# Patient Record
Sex: Female | Born: 1945
Health system: Southern US, Community
[De-identification: ages and names within clinical notes are randomized; demographics above are authoritative.]

## PROBLEM LIST (undated history)

## (undated) DIAGNOSIS — C189 Malignant neoplasm of colon, unspecified: Secondary | ICD-10-CM

## (undated) DIAGNOSIS — E538 Deficiency of other specified B group vitamins: Secondary | ICD-10-CM

## (undated) DIAGNOSIS — C801 Malignant (primary) neoplasm, unspecified: Secondary | ICD-10-CM

## (undated) DIAGNOSIS — Z8051 Family history of malignant neoplasm of kidney: Secondary | ICD-10-CM

## (undated) DIAGNOSIS — M5030 Other cervical disc degeneration, unspecified cervical region: Secondary | ICD-10-CM

## (undated) DIAGNOSIS — R112 Nausea with vomiting, unspecified: Secondary | ICD-10-CM

## (undated) DIAGNOSIS — Z923 Personal history of irradiation: Secondary | ICD-10-CM

## (undated) DIAGNOSIS — Z800 Family history of malignant neoplasm of digestive organs: Secondary | ICD-10-CM

## (undated) DIAGNOSIS — I672 Cerebral atherosclerosis: Secondary | ICD-10-CM

## (undated) DIAGNOSIS — D649 Anemia, unspecified: Secondary | ICD-10-CM

## (undated) DIAGNOSIS — N390 Urinary tract infection, site not specified: Secondary | ICD-10-CM

## (undated) DIAGNOSIS — Z8679 Personal history of other diseases of the circulatory system: Secondary | ICD-10-CM

## (undated) DIAGNOSIS — Z87442 Personal history of urinary calculi: Secondary | ICD-10-CM

## (undated) DIAGNOSIS — Z9221 Personal history of antineoplastic chemotherapy: Secondary | ICD-10-CM

## (undated) DIAGNOSIS — Z8639 Personal history of other endocrine, nutritional and metabolic disease: Secondary | ICD-10-CM

## (undated) DIAGNOSIS — Z8781 Personal history of (healed) traumatic fracture: Secondary | ICD-10-CM

## (undated) DIAGNOSIS — M1990 Unspecified osteoarthritis, unspecified site: Secondary | ICD-10-CM

## (undated) DIAGNOSIS — N281 Cyst of kidney, acquired: Secondary | ICD-10-CM

## (undated) DIAGNOSIS — K219 Gastro-esophageal reflux disease without esophagitis: Secondary | ICD-10-CM

## (undated) DIAGNOSIS — G2581 Restless legs syndrome: Secondary | ICD-10-CM

## (undated) DIAGNOSIS — Z803 Family history of malignant neoplasm of breast: Secondary | ICD-10-CM

## (undated) DIAGNOSIS — M810 Age-related osteoporosis without current pathological fracture: Secondary | ICD-10-CM

## (undated) DIAGNOSIS — I10 Essential (primary) hypertension: Secondary | ICD-10-CM

## (undated) DIAGNOSIS — T7840XA Allergy, unspecified, initial encounter: Secondary | ICD-10-CM

## (undated) DIAGNOSIS — H9319 Tinnitus, unspecified ear: Secondary | ICD-10-CM

## (undated) DIAGNOSIS — M4802 Spinal stenosis, cervical region: Secondary | ICD-10-CM

## (undated) DIAGNOSIS — Z853 Personal history of malignant neoplasm of breast: Secondary | ICD-10-CM

## (undated) HISTORY — DX: Family history of malignant neoplasm of digestive organs: Z80.0

## (undated) HISTORY — DX: Deficiency of other specified B group vitamins: E53.8

## (undated) HISTORY — DX: Unspecified osteoarthritis, unspecified site: M19.90

## (undated) HISTORY — DX: Family history of malignant neoplasm of breast: Z80.3

## (undated) HISTORY — DX: Cyst of kidney, acquired: N28.1

## (undated) HISTORY — DX: Tinnitus, unspecified ear: H93.19

## (undated) HISTORY — DX: Personal history of other endocrine, nutritional and metabolic disease: Z86.39

## (undated) HISTORY — DX: Malignant (primary) neoplasm, unspecified: C80.1

## (undated) HISTORY — DX: Personal history of malignant neoplasm of breast: Z85.3

## (undated) HISTORY — DX: Cerebral atherosclerosis: I67.2

## (undated) HISTORY — PX: COLONOSCOPY: SHX174

## (undated) HISTORY — PX: KNEE ARTHROSCOPY: SUR90

## (undated) HISTORY — DX: Allergy, unspecified, initial encounter: T78.40XA

## (undated) HISTORY — DX: Personal history of other diseases of the circulatory system: Z86.79

## (undated) HISTORY — DX: Personal history of (healed) traumatic fracture: Z87.81

## (undated) HISTORY — DX: Essential (primary) hypertension: I10

## (undated) HISTORY — DX: Other cervical disc degeneration, unspecified cervical region: M50.30

## (undated) HISTORY — PX: CHOLECYSTECTOMY: SHX55

## (undated) HISTORY — DX: Spinal stenosis, cervical region: M48.02

## (undated) HISTORY — PX: BREAST LUMPECTOMY: SHX2

## (undated) HISTORY — DX: Gastro-esophageal reflux disease without esophagitis: K21.9

## (undated) HISTORY — DX: Urinary tract infection, site not specified: N39.0

## (undated) HISTORY — PX: PARTIAL HYSTERECTOMY: SHX80

## (undated) HISTORY — DX: Anemia, unspecified: D64.9

## (undated) HISTORY — DX: Family history of malignant neoplasm of kidney: Z80.51

## (undated) HISTORY — DX: Restless legs syndrome: G25.81

## (undated) HISTORY — DX: Age-related osteoporosis without current pathological fracture: M81.0

## (undated) HISTORY — PX: OTHER SURGICAL HISTORY: SHX169

---

## 1999-03-05 ENCOUNTER — Other Ambulatory Visit: Admission: RE | Admit: 1999-03-05 | Discharge: 1999-03-05 | Payer: Self-pay | Admitting: Gynecology

## 1999-10-18 ENCOUNTER — Encounter: Admission: RE | Admit: 1999-10-18 | Discharge: 1999-10-18 | Payer: Self-pay | Admitting: Urology

## 1999-10-18 ENCOUNTER — Encounter: Payer: Self-pay | Admitting: General Surgery

## 2000-10-19 ENCOUNTER — Encounter: Payer: Self-pay | Admitting: Gynecology

## 2000-10-19 ENCOUNTER — Encounter: Admission: RE | Admit: 2000-10-19 | Discharge: 2000-10-19 | Payer: Self-pay | Admitting: Gynecology

## 2001-10-20 ENCOUNTER — Encounter: Payer: Self-pay | Admitting: Gynecology

## 2001-10-20 ENCOUNTER — Encounter: Admission: RE | Admit: 2001-10-20 | Discharge: 2001-10-20 | Payer: Self-pay | Admitting: Gynecology

## 2002-12-14 ENCOUNTER — Encounter: Admission: RE | Admit: 2002-12-14 | Discharge: 2002-12-14 | Payer: Self-pay | Admitting: Gynecology

## 2003-01-09 ENCOUNTER — Encounter (HOSPITAL_COMMUNITY): Admission: RE | Admit: 2003-01-09 | Discharge: 2003-04-09 | Payer: Self-pay | Admitting: Gynecology

## 2004-01-12 ENCOUNTER — Encounter: Admission: RE | Admit: 2004-01-12 | Discharge: 2004-01-12 | Payer: Self-pay | Admitting: Gynecology

## 2004-05-23 ENCOUNTER — Other Ambulatory Visit: Admission: RE | Admit: 2004-05-23 | Discharge: 2004-05-23 | Payer: Self-pay | Admitting: Gynecology

## 2005-02-10 HISTORY — PX: BREAST LUMPECTOMY: SHX2

## 2005-05-07 ENCOUNTER — Encounter: Admission: RE | Admit: 2005-05-07 | Discharge: 2005-05-07 | Payer: Self-pay | Admitting: General Surgery

## 2005-05-07 ENCOUNTER — Encounter (INDEPENDENT_AMBULATORY_CARE_PROVIDER_SITE_OTHER): Payer: Self-pay | Admitting: Diagnostic Radiology

## 2005-05-07 ENCOUNTER — Encounter (INDEPENDENT_AMBULATORY_CARE_PROVIDER_SITE_OTHER): Payer: Self-pay | Admitting: Specialist

## 2005-05-24 ENCOUNTER — Encounter: Admission: RE | Admit: 2005-05-24 | Discharge: 2005-05-24 | Payer: Self-pay | Admitting: General Surgery

## 2005-05-28 ENCOUNTER — Encounter: Admission: RE | Admit: 2005-05-28 | Discharge: 2005-05-28 | Payer: Self-pay | Admitting: General Surgery

## 2005-05-29 ENCOUNTER — Ambulatory Visit (HOSPITAL_BASED_OUTPATIENT_CLINIC_OR_DEPARTMENT_OTHER): Admission: RE | Admit: 2005-05-29 | Discharge: 2005-05-29 | Payer: Self-pay | Admitting: General Surgery

## 2005-05-29 ENCOUNTER — Encounter (INDEPENDENT_AMBULATORY_CARE_PROVIDER_SITE_OTHER): Payer: Self-pay | Admitting: Specialist

## 2005-06-02 ENCOUNTER — Ambulatory Visit: Payer: Self-pay | Admitting: Oncology

## 2005-06-11 LAB — COMPREHENSIVE METABOLIC PANEL
ALT: 19 U/L (ref 0–40)
AST: 24 U/L (ref 0–37)
Alkaline Phosphatase: 78 U/L (ref 39–117)
Chloride: 104 mEq/L (ref 96–112)
Creatinine, Ser: 1 mg/dL (ref 0.4–1.2)
Total Bilirubin: 0.4 mg/dL (ref 0.3–1.2)

## 2005-06-11 LAB — CBC WITH DIFFERENTIAL/PLATELET
BASO%: 0.9 % (ref 0.0–2.0)
EOS%: 0.5 % (ref 0.0–7.0)
HCT: 39.1 % (ref 34.8–46.6)
LYMPH%: 23.3 % (ref 14.0–48.0)
MCH: 28.9 pg (ref 26.0–34.0)
MCHC: 34.1 g/dL (ref 32.0–36.0)
MCV: 84.7 fL (ref 81.0–101.0)
MONO%: 3.4 % (ref 0.0–13.0)
NEUT%: 71.9 % (ref 39.6–76.8)
lymph#: 1.7 10*3/uL (ref 0.9–3.3)

## 2005-06-20 ENCOUNTER — Ambulatory Visit (HOSPITAL_COMMUNITY): Admission: RE | Admit: 2005-06-20 | Discharge: 2005-06-20 | Payer: Self-pay | Admitting: Oncology

## 2005-06-23 ENCOUNTER — Ambulatory Visit: Payer: Self-pay | Admitting: Oncology

## 2005-06-25 ENCOUNTER — Ambulatory Visit: Payer: Self-pay | Admitting: Cardiology

## 2005-06-25 ENCOUNTER — Encounter: Payer: Self-pay | Admitting: Cardiology

## 2005-06-25 ENCOUNTER — Ambulatory Visit: Admission: RE | Admit: 2005-06-25 | Discharge: 2005-06-25 | Payer: Self-pay | Admitting: Oncology

## 2005-06-27 ENCOUNTER — Ambulatory Visit (HOSPITAL_COMMUNITY): Admission: RE | Admit: 2005-06-27 | Discharge: 2005-06-27 | Payer: Self-pay | Admitting: General Surgery

## 2005-07-04 ENCOUNTER — Ambulatory Visit: Payer: Self-pay | Admitting: Oncology

## 2005-08-18 ENCOUNTER — Ambulatory Visit: Payer: Self-pay | Admitting: Oncology

## 2005-10-01 ENCOUNTER — Ambulatory Visit: Payer: Self-pay | Admitting: Cardiology

## 2005-10-01 ENCOUNTER — Ambulatory Visit: Admission: RE | Admit: 2005-10-01 | Discharge: 2005-10-01 | Payer: Self-pay | Admitting: Oncology

## 2005-10-01 ENCOUNTER — Encounter: Payer: Self-pay | Admitting: Cardiology

## 2005-10-03 ENCOUNTER — Ambulatory Visit: Payer: Self-pay | Admitting: Oncology

## 2005-10-10 ENCOUNTER — Ambulatory Visit: Payer: Self-pay | Admitting: Oncology

## 2005-11-21 ENCOUNTER — Ambulatory Visit: Payer: Self-pay | Admitting: Oncology

## 2005-12-11 ENCOUNTER — Ambulatory Visit (HOSPITAL_COMMUNITY): Admission: RE | Admit: 2005-12-11 | Discharge: 2005-12-11 | Payer: Self-pay | Admitting: Oncology

## 2005-12-16 ENCOUNTER — Ambulatory Visit: Admission: EM | Admit: 2005-12-16 | Discharge: 2005-12-16 | Payer: Self-pay | Admitting: Oncology

## 2005-12-16 ENCOUNTER — Encounter (INDEPENDENT_AMBULATORY_CARE_PROVIDER_SITE_OTHER): Payer: Self-pay | Admitting: Cardiology

## 2005-12-17 ENCOUNTER — Encounter: Admission: RE | Admit: 2005-12-17 | Discharge: 2005-12-17 | Payer: Self-pay | Admitting: Oncology

## 2005-12-22 ENCOUNTER — Ambulatory Visit: Admission: RE | Admit: 2005-12-22 | Discharge: 2006-03-11 | Payer: Self-pay | Admitting: Radiation Oncology

## 2006-01-09 ENCOUNTER — Ambulatory Visit: Payer: Self-pay | Admitting: Oncology

## 2006-02-27 ENCOUNTER — Ambulatory Visit: Payer: Self-pay | Admitting: Oncology

## 2006-04-02 ENCOUNTER — Ambulatory Visit: Admission: RE | Admit: 2006-04-02 | Discharge: 2006-04-02 | Payer: Self-pay | Admitting: Oncology

## 2006-04-02 ENCOUNTER — Encounter (INDEPENDENT_AMBULATORY_CARE_PROVIDER_SITE_OTHER): Payer: Self-pay | Admitting: *Deleted

## 2006-04-27 ENCOUNTER — Encounter: Admission: RE | Admit: 2006-04-27 | Discharge: 2006-04-27 | Payer: Self-pay | Admitting: General Surgery

## 2006-06-24 IMAGING — PT NM PET TUM IMG SKULL BASE T - THIGH
5 series · 25 of 25 positions shown · non-contrast
Comparison: none

CLINICAL DATA: Right breast cancer; status-post excision on 05/29/05 with maxillary lymph node dissection.  The patient does have a positive right axillary sentinel node. 
FDG PET-CT TUMOR IMAGING (SKULL BASE TO THIGHS):
Fasting Blood Glucose:  99
TECHNIQUE: 15.9 mCi F-18 FDG were administered via left antecubital injection site.  Full ring PET imaging was performed from the skull base through the mid-thighs 60 minutes after injection.  CT data was obtained and used for attenuation correction and anatomic localization only.  (This was not acquired as a diagnostic CT examination.)

[Series 1: pet ac · axial · 3.3mm · 4.69mm/px · z∈[-870,+0]mm · 7 of 267 slices shown]
[im 1/267]
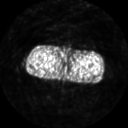
[im 45/267]
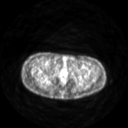
[im 89/267]
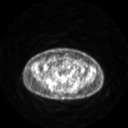
[im 134/267]
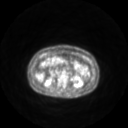
[im 178/267]
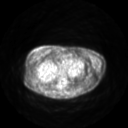
[im 222/267]
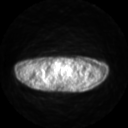
[im 267/267]
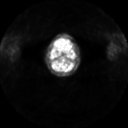

[Series 2: pet nac · axial · 3.3mm · 4.69mm/px · z∈[-870,+0]mm · 8 of 267 slices shown]
[im 1/267]
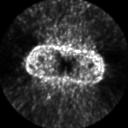
[im 39/267]
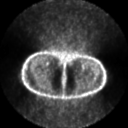
[im 77/267]
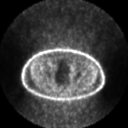
[im 115/267]
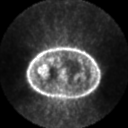
[im 153/267]
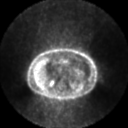
[im 191/267]
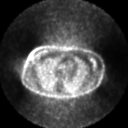
[im 229/267]
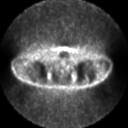
[im 267/267]
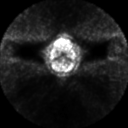

[Series 2: ct images · axial · 3.8mm · 0.98mm/px · z∈[-870,+0]mm · 8 of 267 slices shown]
[im 1/267]
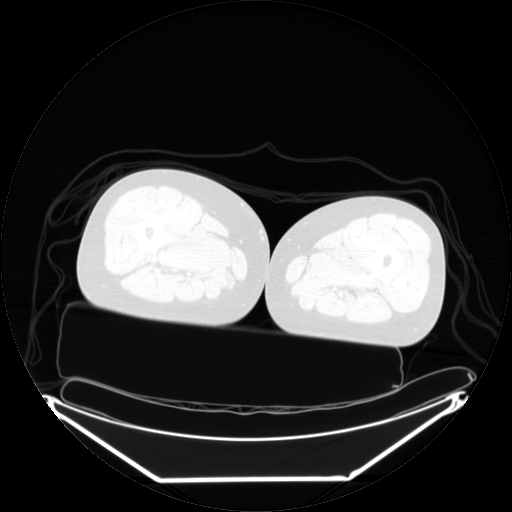
[im 39/267]
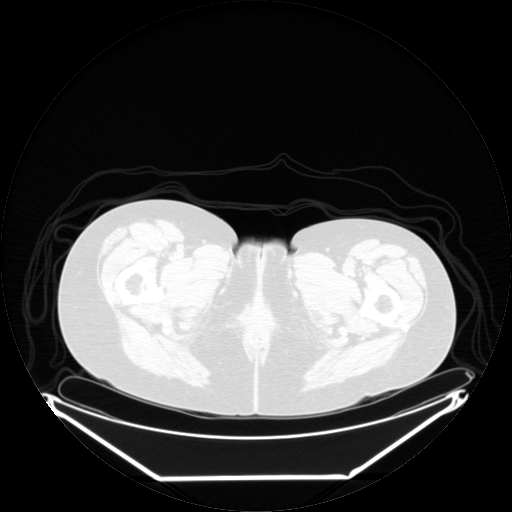
[im 77/267]
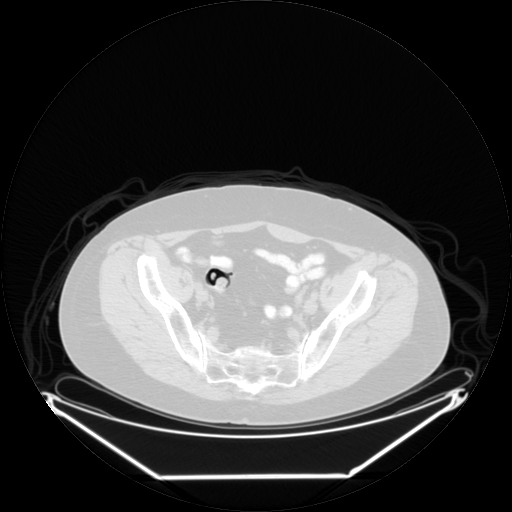
[im 115/267]
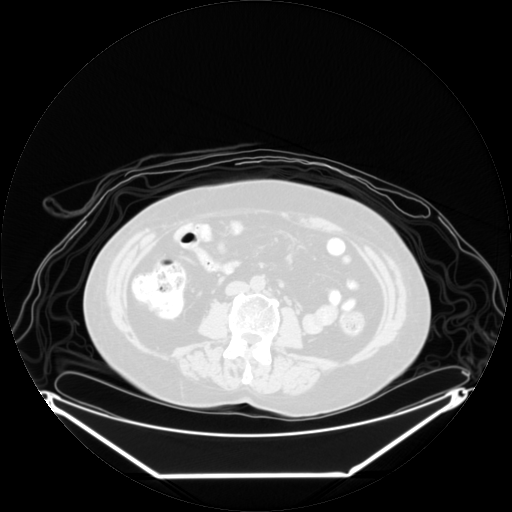
[im 153/267]
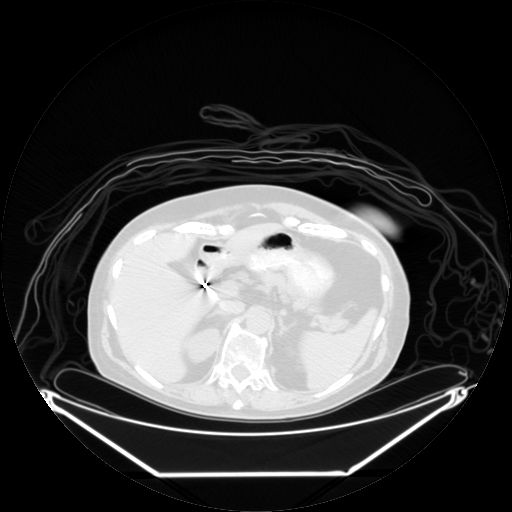
[im 191/267]
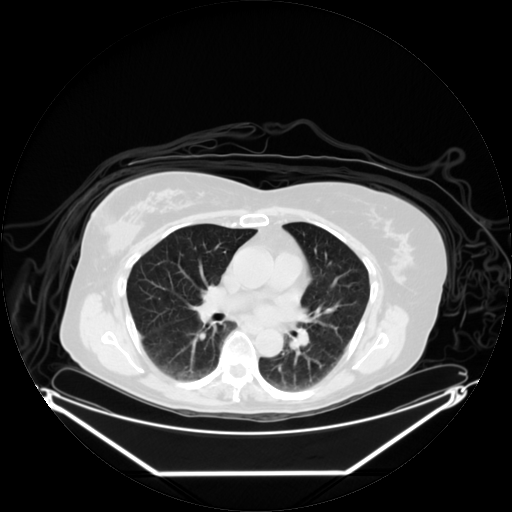
[im 229/267  brain]
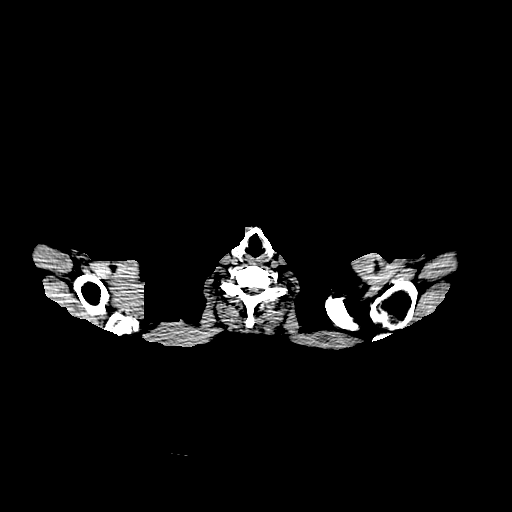
[im 267/267  brain]
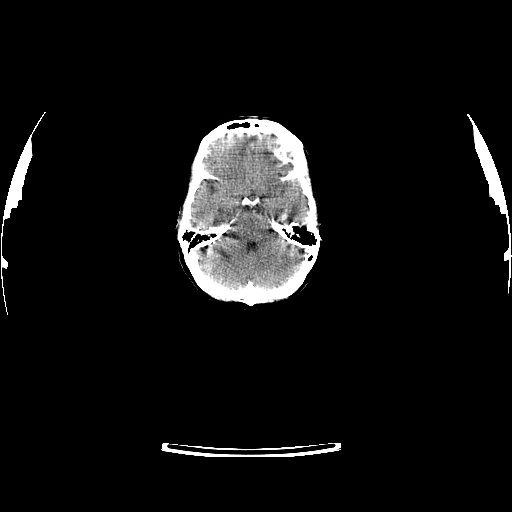

[Series 123: mip · coronal · 3.3mm · 4.69mm/px · 1 of 30 slices shown]
[im 1/30]
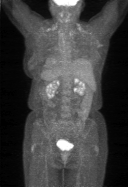

[Series 151: reformatted · axial · 3.3mm · 1.17mm/px · 1 of 17 slices shown]
[im 1/17]
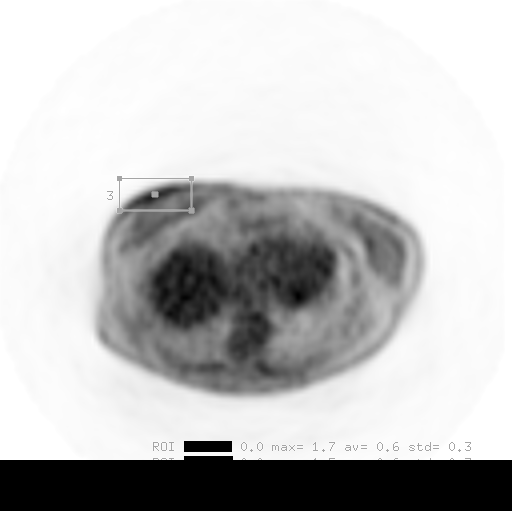

[25 of 25 positions shown; findings below may reference images not displayed]

FINDINGS: There is low level FDG uptake in the right axilla with a SUV max in the 1.5 to 1.7 range.  This uptake corresponds to ill defined soft tissue attenuation and is felt to be most likely related to the patient?s recent surgery.  The CT data demonstrates focal fluid collections within the upper outer aspect of the deep right breast, adjacent to the surgical clips.  The 2nd collection is in the right axilla and measures approximately 3.5 cm.   This is adjacent to the right axillary clips.  For the most part these fluid collections are photopenic on the PET scan although there is some low level peripheral uptake.  Imaging features are compatible with postoperative seromas or hematomas.  Of less clear significance is the apparent skin thickening over the central breast.  This shows FDG accumulation with a SUV max of 2.1.  The patient reports that she has not had any treatment, but if there has indeed been radiation change in this area, the XRT could account for the skin thickening and reactive FDG uptake.  If the patient has truly not had any prior treatment other than the surgery, mastitis or inflammatory breast carcinoma must both be considered.  Given the apparent skin thickening on CT, there would likely be physical exam findings which could be correlated.
Patchy uptake in the fat of the lower neck posteriorly is compatible with hypermetabolic brown fat.  Somewhat more confusing is the uptake in the region of the thoracic region bilaterally.  On both the CT data obtained for attenuation correction and the correlative diagnostic CT chest performed today, there is no evidence for lymphadenopathy in either thoracic inlet.  However the thyroid gland shows nodularity bilaterally with a somewhat exophytic nodule on the left.  I think this may account for the left thoracic inlet focal hypermetabolic activity with misregistration.  There is also uptake on the right and a small nodule is seen in the right thyroid lobe on the CT data.  However there is more uptake in the region of the right thoracic inlet than can be accounted for by misregistration from the right thyroid nodule.  Again as there is no discernible lymph node in this region, this is probably from hypermetabolic brown fat.  The SUV for these areas in the thoracic inlet ranges from 2.1 to 2.9 which is very similar to that which is measured over the posterior hypermetabolic fat in the lower neck. 
Physiologic uptake is seen in the kidneys and bowel with radiotracer accumulation in the bowel as expected. 
A focus of increased FDG uptake in the sigmoid colon, just posterior to the bladder has an SUV max of 3.2.  This corresponds to a somewhat nodular soft tissue attenuating focus in the sigmoid colon which could be stool although an adenomatous polyp or neoplasm could also have this appearance.  Correlation with recent colorectal cancer screening recommended and if the patient has had none, colonoscopy may prove helpful.
IMPRESSION: 1.  Focal fluid collections in the upper outer deep right breast and right axilla compatible with postoperative seroma or hematoma.  There is some low level FDG accumulation around these lesions which is likely related to the granulation/inflammation secondary to healing.  
2.  Skin thickening over the central breast with asymmetric FDG accumulation in this area.  Please see discussion above.  This should be amenable to clinical inspection and if the patient has not had radiation to the right breast, mastitis or inflammatory breast carcinoma must both be considered. 
3.  Increase focal uptake in the region of the thoracic inlet/lower central neck.  The patient has bilateral thyroid nodules and some of these changes may be related to misregistration from the thyroid gland.  Close inspection of this study and the diagnostic chest CT performed today revealed no lymph nodes in this area.  If the uptake is not related to the thyroid gland then it is probably related to focal areas of hypermetabolic brown fat as there is more clear hypermetabolic brown fat involvement in the posterior lower neck.  Continued attention to this area on followup imaging is recommended.  Ultrasound assessment of the thyroid gland is also recommended. 
4.  A single focus of radiotracer accumulation in the distal sigmoid colon/rectum, posterior to the bladder.  This may be physiologic bowel uptake but there is a nodular focus in the colon at this area.  Correlation with colorectal screening history recommended and if the patient has not been recently screened, colonoscopy may prove helpful.

## 2006-08-10 ENCOUNTER — Ambulatory Visit: Payer: Self-pay | Admitting: Oncology

## 2006-09-10 ENCOUNTER — Ambulatory Visit: Admission: RE | Admit: 2006-09-10 | Discharge: 2006-09-10 | Payer: Self-pay | Admitting: Oncology

## 2006-11-06 ENCOUNTER — Ambulatory Visit: Payer: Self-pay | Admitting: Oncology

## 2007-04-28 ENCOUNTER — Encounter: Admission: RE | Admit: 2007-04-28 | Discharge: 2007-04-28 | Payer: Self-pay | Admitting: Oncology

## 2008-05-05 ENCOUNTER — Encounter: Admission: RE | Admit: 2008-05-05 | Discharge: 2008-05-05 | Payer: Self-pay | Admitting: Oncology

## 2009-05-10 ENCOUNTER — Encounter: Admission: RE | Admit: 2009-05-10 | Discharge: 2009-05-10 | Payer: Self-pay | Admitting: Oncology

## 2010-03-03 ENCOUNTER — Encounter: Payer: Self-pay | Admitting: Oncology

## 2010-05-06 ENCOUNTER — Other Ambulatory Visit: Payer: Self-pay | Admitting: Oncology

## 2010-05-06 DIAGNOSIS — Z9889 Other specified postprocedural states: Secondary | ICD-10-CM

## 2010-06-28 NOTE — Op Note (Signed)
NAME:  Marissa Black, Marissa Black                ACCOUNT NO.:  [id number]   MEDICAL RECORD NO.:  [id number]          PATIENT TYPE:  AMB   LOCATION:  DAY                          FACILITY:  [facility]   PHYSICIAN:  Timothy E. Earlene Plater, M.D. DATE OF BIRTH:  1945/07/03   DATE OF PROCEDURE:  06/27/2005  DATE OF DISCHARGE:                                 OPERATIVE REPORT   PREOPERATIVE DIAGNOSIS:  Stage II carcinoma, right breast.  Preparation for  chemotherapy.   POSTOPERATIVE DIAGNOSIS:  Stage II carcinoma, right breast.  Preparation for  chemotherapy.   PROCEDURE:  Placement of Port-A-Cath.   SURGEON:  Timothy E. Earlene Plater, M.D.   ANESTHESIA:  Local with standby.   Ms. Klahr presents for placement of Port-A-Cath, which will begin in   AFB with Dr. Gilman Buttner in two weeks.  She is prepared.  She is  identified, and the permit signed.   She is taken to the operating room, where she positions herself on the table  supine.  Arms are tucked.  IV sedation is given.  The table is positioned  with the head down.  The entire chest and neck are prepped and draped in the  usual fashion.  Because the breast cancer is on the right, we approached the  left using 0.25% Marcaine with epinephrine.  The left subclavian vein  isolated on the first pass.  Guidewire inserted.  Using real time C-arm  radiology, positions were all ascertained.  Penetrating needle removed.  Penetrating skin incision opened a bit, and the trocar placed over the  guidewire.  Guidewire removed.  Through the trocar was then placed the  irrigating catheter of the Port-A-Cath device.  The position was  ascertained.  The trocar removed.  It flushed well.  Looked fine on the C-  arm.  Then a pocket was made beneath the insertion site on the left upper  chest after using local, and then the catheter was tunneled from the  insertion site to the port site.  Again ascertained to be in positioned and  then connected with the locking device.  Again,  position looked good.  There  were no complications.  There is no evidence of pneumothorax.  All wounds  were dry.  The port device was sutured to the prepectoral fascia with  interrupted 2-0 Prolene.  The device irrigated well.  The wound was closed  in layers with Monocryl, and then carefully opposed with Steri-Strips.  Again, it was checked, flushed, marked, and then covered with a plastic  clear dressing.  Then concentrated heparin  was flushed through the port device and again, it functioned well.  She  tolerated it well.  Counts were correct.  She was removed to the recovery  room in good condition.  She will be followed as an outpatient.  No special  instructions at this time.  Follow up with Dr. Gilman Buttner.      Timothy E. Earlene Plater, M.D.  Electronically Signed     TED/MEDQ  D:  06/27/2005  T:  06/27/2005  Job:  [id number]   cc:   Dellia Beckwith, M.D.  Fax: [phone]

## 2010-06-28 NOTE — Op Note (Signed)
NAME:  Marissa Black, Marissa Black                ACCOUNT NO.:  [id number]   MEDICAL RECORD NO.:  [id number]          PATIENT TYPE:  AMB   LOCATION:  DSC                          FACILITY:  [facility]   PHYSICIAN:  Timothy E. Earlene Plater, M.D. DATE OF BIRTH:  Dec 29, 1945   DATE OF PROCEDURE:  05/29/2005  DATE OF DISCHARGE:                                 OPERATIVE REPORT   PREOPERATIVE DIAGNOSIS:  Carcinoma of the right breast.   POSTOPERATIVE DIAGNOSIS:  Carcinoma of the right breast.   PROCEDURE:  Injection of blue dye with sentinel lymph node biopsy x6, with a  right partial mastectomy and right completion axillary dissection.   SURGEON:  Timothy E. Earlene Plater, M.D.   ANESTHESIA:  General.   INDICATIONS FOR PROCEDURE:  Marissa Black is age 65 and has been evaluated for a  palpable breast mass, abnormal mammogram and a core biopsy showing invasive  carcinoma of the right breast.  The lesion is in the upper outer quadrant.  Questionable enlarged lymph nodes.  Marissa Black has been counseled and informed and  is ready to proceed.   DESCRIPTION OF PROCEDURE:  Marissa Black was seen earlier in the holding area and  injected with technetium.  Marissa Black is identified and the right breast is marked  and the permit is signed.   The patient was taken to the operating room and placed supine and LMA  anesthesia provided.  The right breast was exposed and blue dye was injected  superficial subcutaneously, right nipple, a total of 4 mL.  This was  massaged for five minutes.  The breast was then prepped and draped in the  usual fashion.  The nipple/areolar complex was marked.  The palpable mass in  the upper outer quadrant was marked.  Using the Neo-probe, the counts over  the nipple area were in the range of 2000-5000.  There were no hot spots  along the mediastinum or the supraclavicular areas, and there was at least  one hot spot in the axilla in the mid-axillary line in the mid-range.  Marcaine 0.5% with epinephrine was used prior to each  incision.  A short  incision was made in the axilla using the Neo-probe.  There was one hot spot  in the mid-level axilla.  This was tracked down to a visible hot blue node  larger than normal measuring 1.5 to 2 cm.  It was bluntly dissected from the  surrounding tissue and submitted as lymph node #1.  There was a visible node  inferior to that.  This too was removed, but it was neither hot nor blue,  and that was set aside.  In the process of carefully tracing out the axilla  intraoperative via the wound, five other hot nodes were found.  Each was  individually dissected and submitted as #1 through #6.  After the sixth  node, there were no other hot spots in the axilla.  Bleeding had been  controlled.  The wound was covered.  The six nodes were sent to the  laboratory.  Again, a curvilinear incision was made over the right breast, a  generous  incision, because this was at least a 2.8 cm mass and palpated even  larger.  In the junction of the deep and superficial subcutaneous, I began  dissecting around the palpable mass.  It was grasped and a complete  dissection was made around and about this seemingly ever enlarging mass.  The final specimen was 9 cm horizontally and 6 cm vertically.  The area of  dissection included from the 12 o'clock position of the right breast to the  7 o'clock position of the right breast.  The posterior margin was the  pectoralis fascia.  The medial margin was beneath the areolar complex.  The  lateral margin was the entire upper outer quadrant of the breast.  This was  carefully marked and sent to the laboratory for touch preps.  Meanwhile, the  pathologist had called back saying that the lymph node #1 was replaced by  tumor.  Therefore we covered the breast wound and then enlarged the axillary  incision and proceeded to attempt to do a complete axillary clearing of  nodes.  In the process of six hot nodes, the normal architecture of the  axilla had been pretty  well changed; and so I removed as much fatty tissue  in the lymph node bearing areas as possible below the axillary nerve, of  course lateral to the chest wall, to the subcutaneous fatty tissue.  That  was submitted, along with the other fragments that we had previously, as  completion axillary dissection.  Meanwhile either clips or cautery had been  used.  That wound was dried.  A drain was placed percutaneously, a #19 mm  and brought inferiorly and tied to the skin.  The wound was closed in layers  with Monocryl.   The pathologist called back.  He thought the tumor was removed with perhaps  margins, but he could not be sure on the superior lateral area, so I went  back into the breast wound and removed additional tissue from high in the  upper outer quadrant area, i.e. towards the axilla which I believed to be  only fatty tissue, but there is certainly nothing else in that superior  lateral area or tail of the breast to be removed.  Bleeding was controlled.  The wound was dry.  It too was closed in layers with Monocryl.  All counts  were correct.   Marissa Black had tolerated this very well, and Marissa Black was removed to the recovery room  in good condition.   INSTRUCTIONS:  Instructions were given to the family.  Also the information  that we had available today.   ADDENDUM TO OPERATIVE NOTE:  Clips were placed in the breast cavity, three  clips on the posterior margin, two clips on the superior lateral margin and  one clip on the medial margin.   DISPOSITION:  The patient was given Percocet for pain.  Marissa Black will be followed  closely in the office.      Timothy E. Earlene Plater, M.D.  Electronically Signed     TED/MEDQ  D:  05/29/2005  T:  05/30/2005  Job:  [id number]

## 2010-07-04 ENCOUNTER — Ambulatory Visit
Admission: RE | Admit: 2010-07-04 | Discharge: 2010-07-04 | Disposition: A | Discharge status: Home / Self Care | Payer: Commercial Indemnity | Source: Ambulatory Visit | Attending: Oncology | Admitting: Oncology

## 2010-07-04 DIAGNOSIS — Z9889 Other specified postprocedural states: Secondary | ICD-10-CM

## 2011-02-17 ENCOUNTER — Other Ambulatory Visit: Payer: Self-pay | Admitting: Gynecology

## 2011-02-17 DIAGNOSIS — Z1272 Encounter for screening for malignant neoplasm of vagina: Secondary | ICD-10-CM | POA: Diagnosis not present

## 2011-02-19 DIAGNOSIS — K219 Gastro-esophageal reflux disease without esophagitis: Secondary | ICD-10-CM | POA: Diagnosis not present

## 2011-02-19 DIAGNOSIS — E559 Vitamin D deficiency, unspecified: Secondary | ICD-10-CM | POA: Diagnosis not present

## 2011-02-19 DIAGNOSIS — J069 Acute upper respiratory infection, unspecified: Secondary | ICD-10-CM | POA: Diagnosis not present

## 2011-02-19 DIAGNOSIS — E039 Hypothyroidism, unspecified: Secondary | ICD-10-CM | POA: Diagnosis not present

## 2011-05-12 ENCOUNTER — Other Ambulatory Visit: Payer: Self-pay | Admitting: Family Medicine

## 2011-05-12 DIAGNOSIS — M25519 Pain in unspecified shoulder: Secondary | ICD-10-CM | POA: Diagnosis not present

## 2011-05-12 DIAGNOSIS — Z23 Encounter for immunization: Secondary | ICD-10-CM | POA: Diagnosis not present

## 2011-05-12 DIAGNOSIS — N644 Mastodynia: Secondary | ICD-10-CM

## 2011-05-15 ENCOUNTER — Ambulatory Visit
Admission: RE | Admit: 2011-05-15 | Discharge: 2011-05-15 | Disposition: A | Discharge status: Home / Self Care | Payer: Medicare Other | Source: Ambulatory Visit | Attending: Family Medicine | Admitting: Family Medicine

## 2011-05-15 ENCOUNTER — Other Ambulatory Visit: Payer: Self-pay | Admitting: Family Medicine

## 2011-05-15 DIAGNOSIS — N644 Mastodynia: Secondary | ICD-10-CM

## 2011-05-15 DIAGNOSIS — Z853 Personal history of malignant neoplasm of breast: Secondary | ICD-10-CM | POA: Diagnosis not present

## 2011-05-22 DIAGNOSIS — E538 Deficiency of other specified B group vitamins: Secondary | ICD-10-CM | POA: Diagnosis not present

## 2011-05-22 DIAGNOSIS — Z853 Personal history of malignant neoplasm of breast: Secondary | ICD-10-CM | POA: Diagnosis not present

## 2011-05-22 DIAGNOSIS — Z09 Encounter for follow-up examination after completed treatment for conditions other than malignant neoplasm: Secondary | ICD-10-CM | POA: Diagnosis not present

## 2011-05-27 ENCOUNTER — Other Ambulatory Visit: Payer: Self-pay | Admitting: Oncology

## 2011-05-27 DIAGNOSIS — Z9889 Other specified postprocedural states: Secondary | ICD-10-CM

## 2011-05-27 DIAGNOSIS — Z853 Personal history of malignant neoplasm of breast: Secondary | ICD-10-CM

## 2011-07-25 ENCOUNTER — Ambulatory Visit
Admission: RE | Admit: 2011-07-25 | Discharge: 2011-07-25 | Disposition: A | Discharge status: Home / Self Care | Payer: Medicare Other | Source: Ambulatory Visit | Attending: Oncology | Admitting: Oncology

## 2011-07-25 DIAGNOSIS — Z9889 Other specified postprocedural states: Secondary | ICD-10-CM

## 2011-07-25 DIAGNOSIS — N6459 Other signs and symptoms in breast: Secondary | ICD-10-CM | POA: Diagnosis not present

## 2011-07-25 DIAGNOSIS — Z853 Personal history of malignant neoplasm of breast: Secondary | ICD-10-CM

## 2011-10-24 DIAGNOSIS — D510 Vitamin B12 deficiency anemia due to intrinsic factor deficiency: Secondary | ICD-10-CM | POA: Diagnosis not present

## 2011-10-24 DIAGNOSIS — J301 Allergic rhinitis due to pollen: Secondary | ICD-10-CM | POA: Diagnosis not present

## 2011-11-21 ENCOUNTER — Other Ambulatory Visit: Payer: Self-pay | Admitting: Nurse Practitioner

## 2011-11-24 DIAGNOSIS — E559 Vitamin D deficiency, unspecified: Secondary | ICD-10-CM | POA: Diagnosis not present

## 2011-11-24 DIAGNOSIS — E039 Hypothyroidism, unspecified: Secondary | ICD-10-CM | POA: Diagnosis not present

## 2011-11-24 DIAGNOSIS — M545 Low back pain: Secondary | ICD-10-CM | POA: Diagnosis not present

## 2011-11-24 DIAGNOSIS — Z79899 Other long term (current) drug therapy: Secondary | ICD-10-CM | POA: Diagnosis not present

## 2011-11-24 DIAGNOSIS — E782 Mixed hyperlipidemia: Secondary | ICD-10-CM | POA: Diagnosis not present

## 2011-11-24 DIAGNOSIS — D510 Vitamin B12 deficiency anemia due to intrinsic factor deficiency: Secondary | ICD-10-CM | POA: Diagnosis not present

## 2011-11-24 DIAGNOSIS — K219 Gastro-esophageal reflux disease without esophagitis: Secondary | ICD-10-CM | POA: Diagnosis not present

## 2012-01-26 DIAGNOSIS — H6120 Impacted cerumen, unspecified ear: Secondary | ICD-10-CM | POA: Diagnosis not present

## 2012-01-26 DIAGNOSIS — R42 Dizziness and giddiness: Secondary | ICD-10-CM | POA: Diagnosis not present

## 2012-01-26 DIAGNOSIS — R1013 Epigastric pain: Secondary | ICD-10-CM | POA: Diagnosis not present

## 2012-01-26 DIAGNOSIS — H6980 Other specified disorders of Eustachian tube, unspecified ear: Secondary | ICD-10-CM | POA: Diagnosis not present

## 2012-05-27 DIAGNOSIS — R5383 Other fatigue: Secondary | ICD-10-CM | POA: Diagnosis not present

## 2012-05-27 DIAGNOSIS — E039 Hypothyroidism, unspecified: Secondary | ICD-10-CM | POA: Diagnosis not present

## 2012-05-27 DIAGNOSIS — D510 Vitamin B12 deficiency anemia due to intrinsic factor deficiency: Secondary | ICD-10-CM | POA: Diagnosis not present

## 2012-05-27 DIAGNOSIS — Z79899 Other long term (current) drug therapy: Secondary | ICD-10-CM | POA: Diagnosis not present

## 2012-05-27 DIAGNOSIS — E782 Mixed hyperlipidemia: Secondary | ICD-10-CM | POA: Diagnosis not present

## 2012-05-27 DIAGNOSIS — R072 Precordial pain: Secondary | ICD-10-CM | POA: Diagnosis not present

## 2012-06-01 DIAGNOSIS — H66009 Acute suppurative otitis media without spontaneous rupture of ear drum, unspecified ear: Secondary | ICD-10-CM | POA: Diagnosis not present

## 2012-06-01 DIAGNOSIS — J0100 Acute maxillary sinusitis, unspecified: Secondary | ICD-10-CM | POA: Diagnosis not present

## 2012-07-07 DIAGNOSIS — J00 Acute nasopharyngitis [common cold]: Secondary | ICD-10-CM | POA: Diagnosis not present

## 2012-07-07 DIAGNOSIS — D510 Vitamin B12 deficiency anemia due to intrinsic factor deficiency: Secondary | ICD-10-CM | POA: Diagnosis not present

## 2012-07-08 DIAGNOSIS — J309 Allergic rhinitis, unspecified: Secondary | ICD-10-CM | POA: Diagnosis not present

## 2012-07-22 ENCOUNTER — Other Ambulatory Visit: Payer: Self-pay | Admitting: Oncology

## 2012-07-22 DIAGNOSIS — Z853 Personal history of malignant neoplasm of breast: Secondary | ICD-10-CM

## 2012-08-18 ENCOUNTER — Ambulatory Visit
Admission: RE | Admit: 2012-08-18 | Discharge: 2012-08-18 | Disposition: A | Discharge status: Home / Self Care | Payer: Medicare Other | Source: Ambulatory Visit | Attending: Oncology | Admitting: Oncology

## 2012-08-18 DIAGNOSIS — R922 Inconclusive mammogram: Secondary | ICD-10-CM | POA: Diagnosis not present

## 2012-08-18 DIAGNOSIS — Z853 Personal history of malignant neoplasm of breast: Secondary | ICD-10-CM

## 2012-09-22 DIAGNOSIS — H1030 Unspecified acute conjunctivitis, unspecified eye: Secondary | ICD-10-CM | POA: Diagnosis not present

## 2012-09-22 DIAGNOSIS — J00 Acute nasopharyngitis [common cold]: Secondary | ICD-10-CM | POA: Diagnosis not present

## 2012-10-18 DIAGNOSIS — Z853 Personal history of malignant neoplasm of breast: Secondary | ICD-10-CM | POA: Diagnosis not present

## 2012-10-18 DIAGNOSIS — Z09 Encounter for follow-up examination after completed treatment for conditions other than malignant neoplasm: Secondary | ICD-10-CM | POA: Diagnosis not present

## 2012-10-18 DIAGNOSIS — R079 Chest pain, unspecified: Secondary | ICD-10-CM | POA: Diagnosis not present

## 2012-10-18 DIAGNOSIS — E538 Deficiency of other specified B group vitamins: Secondary | ICD-10-CM | POA: Diagnosis not present

## 2012-10-19 DIAGNOSIS — E538 Deficiency of other specified B group vitamins: Secondary | ICD-10-CM | POA: Diagnosis not present

## 2013-05-09 DIAGNOSIS — D518 Other vitamin B12 deficiency anemias: Secondary | ICD-10-CM | POA: Diagnosis not present

## 2013-05-09 DIAGNOSIS — E539 Vitamin B deficiency, unspecified: Secondary | ICD-10-CM | POA: Diagnosis not present

## 2013-05-18 DIAGNOSIS — D510 Vitamin B12 deficiency anemia due to intrinsic factor deficiency: Secondary | ICD-10-CM | POA: Diagnosis not present

## 2013-05-18 DIAGNOSIS — Z0000 Encounter for general adult medical examination without abnormal findings: Secondary | ICD-10-CM | POA: Diagnosis not present

## 2013-05-18 DIAGNOSIS — M25569 Pain in unspecified knee: Secondary | ICD-10-CM | POA: Diagnosis not present

## 2013-05-18 DIAGNOSIS — M25559 Pain in unspecified hip: Secondary | ICD-10-CM | POA: Diagnosis not present

## 2013-05-19 DIAGNOSIS — M549 Dorsalgia, unspecified: Secondary | ICD-10-CM | POA: Diagnosis not present

## 2013-05-19 DIAGNOSIS — M79609 Pain in unspecified limb: Secondary | ICD-10-CM | POA: Diagnosis not present

## 2013-06-17 DIAGNOSIS — J0100 Acute maxillary sinusitis, unspecified: Secondary | ICD-10-CM | POA: Diagnosis not present

## 2013-12-19 DIAGNOSIS — D519 Vitamin B12 deficiency anemia, unspecified: Secondary | ICD-10-CM | POA: Diagnosis not present

## 2013-12-20 ENCOUNTER — Other Ambulatory Visit: Payer: Self-pay

## 2013-12-20 DIAGNOSIS — Z853 Personal history of malignant neoplasm of breast: Secondary | ICD-10-CM

## 2013-12-20 DIAGNOSIS — Z1231 Encounter for screening mammogram for malignant neoplasm of breast: Secondary | ICD-10-CM

## 2013-12-21 ENCOUNTER — Encounter (INDEPENDENT_AMBULATORY_CARE_PROVIDER_SITE_OTHER): Payer: Self-pay

## 2013-12-21 ENCOUNTER — Ambulatory Visit
Admission: RE | Admit: 2013-12-21 | Discharge: 2013-12-21 | Disposition: A | Discharge status: Home / Self Care | Payer: Medicare Other | Source: Ambulatory Visit

## 2013-12-21 DIAGNOSIS — Z1231 Encounter for screening mammogram for malignant neoplasm of breast: Secondary | ICD-10-CM

## 2013-12-21 DIAGNOSIS — Z853 Personal history of malignant neoplasm of breast: Secondary | ICD-10-CM

## 2013-12-28 DIAGNOSIS — Z853 Personal history of malignant neoplasm of breast: Secondary | ICD-10-CM | POA: Diagnosis not present

## 2013-12-28 DIAGNOSIS — E538 Deficiency of other specified B group vitamins: Secondary | ICD-10-CM | POA: Diagnosis not present

## 2013-12-28 DIAGNOSIS — M8580 Other specified disorders of bone density and structure, unspecified site: Secondary | ICD-10-CM | POA: Diagnosis not present

## 2014-01-30 DIAGNOSIS — H6981 Other specified disorders of Eustachian tube, right ear: Secondary | ICD-10-CM | POA: Diagnosis not present

## 2014-01-30 DIAGNOSIS — D510 Vitamin B12 deficiency anemia due to intrinsic factor deficiency: Secondary | ICD-10-CM | POA: Diagnosis not present

## 2014-02-17 DIAGNOSIS — R5383 Other fatigue: Secondary | ICD-10-CM | POA: Diagnosis not present

## 2014-02-17 DIAGNOSIS — Z13220 Encounter for screening for lipoid disorders: Secondary | ICD-10-CM | POA: Diagnosis not present

## 2014-02-17 DIAGNOSIS — D518 Other vitamin B12 deficiency anemias: Secondary | ICD-10-CM | POA: Diagnosis not present

## 2014-02-17 DIAGNOSIS — R5381 Other malaise: Secondary | ICD-10-CM | POA: Diagnosis not present

## 2014-02-17 DIAGNOSIS — R946 Abnormal results of thyroid function studies: Secondary | ICD-10-CM | POA: Diagnosis not present

## 2014-02-17 DIAGNOSIS — H9313 Tinnitus, bilateral: Secondary | ICD-10-CM | POA: Diagnosis not present

## 2014-05-11 DIAGNOSIS — R319 Hematuria, unspecified: Secondary | ICD-10-CM | POA: Diagnosis not present

## 2014-05-11 DIAGNOSIS — D510 Vitamin B12 deficiency anemia due to intrinsic factor deficiency: Secondary | ICD-10-CM | POA: Diagnosis not present

## 2014-05-11 DIAGNOSIS — R0789 Other chest pain: Secondary | ICD-10-CM | POA: Diagnosis not present

## 2014-05-19 DIAGNOSIS — R319 Hematuria, unspecified: Secondary | ICD-10-CM | POA: Diagnosis not present

## 2014-05-19 DIAGNOSIS — N200 Calculus of kidney: Secondary | ICD-10-CM | POA: Diagnosis not present

## 2014-05-19 DIAGNOSIS — N281 Cyst of kidney, acquired: Secondary | ICD-10-CM | POA: Diagnosis not present

## 2014-06-30 DIAGNOSIS — Z0000 Encounter for general adult medical examination without abnormal findings: Secondary | ICD-10-CM | POA: Diagnosis not present

## 2014-06-30 DIAGNOSIS — D510 Vitamin B12 deficiency anemia due to intrinsic factor deficiency: Secondary | ICD-10-CM | POA: Diagnosis not present

## 2014-06-30 DIAGNOSIS — J069 Acute upper respiratory infection, unspecified: Secondary | ICD-10-CM | POA: Diagnosis not present

## 2014-07-13 DIAGNOSIS — E539 Vitamin B deficiency, unspecified: Secondary | ICD-10-CM | POA: Diagnosis not present

## 2014-07-13 DIAGNOSIS — H9201 Otalgia, right ear: Secondary | ICD-10-CM | POA: Diagnosis not present

## 2014-07-26 DIAGNOSIS — K5730 Diverticulosis of large intestine without perforation or abscess without bleeding: Secondary | ICD-10-CM | POA: Diagnosis not present

## 2014-07-26 DIAGNOSIS — N200 Calculus of kidney: Secondary | ICD-10-CM | POA: Diagnosis not present

## 2014-08-17 DIAGNOSIS — D518 Other vitamin B12 deficiency anemias: Secondary | ICD-10-CM | POA: Diagnosis not present

## 2014-09-19 DIAGNOSIS — D518 Other vitamin B12 deficiency anemias: Secondary | ICD-10-CM | POA: Diagnosis not present

## 2014-12-13 ENCOUNTER — Other Ambulatory Visit: Payer: Self-pay | Admitting: Oncology

## 2014-12-13 DIAGNOSIS — Z1231 Encounter for screening mammogram for malignant neoplasm of breast: Secondary | ICD-10-CM

## 2014-12-27 ENCOUNTER — Ambulatory Visit
Admission: RE | Admit: 2014-12-27 | Discharge: 2014-12-27 | Disposition: A | Discharge status: Home / Self Care | Payer: Medicare Other | Source: Ambulatory Visit | Attending: Oncology | Admitting: Oncology

## 2014-12-27 DIAGNOSIS — Z1231 Encounter for screening mammogram for malignant neoplasm of breast: Secondary | ICD-10-CM

## 2014-12-29 DIAGNOSIS — D649 Anemia, unspecified: Secondary | ICD-10-CM | POA: Diagnosis not present

## 2014-12-29 DIAGNOSIS — M419 Scoliosis, unspecified: Secondary | ICD-10-CM | POA: Diagnosis not present

## 2014-12-29 DIAGNOSIS — R0789 Other chest pain: Secondary | ICD-10-CM | POA: Diagnosis not present

## 2014-12-29 DIAGNOSIS — M549 Dorsalgia, unspecified: Secondary | ICD-10-CM | POA: Diagnosis not present

## 2014-12-29 DIAGNOSIS — S299XXA Unspecified injury of thorax, initial encounter: Secondary | ICD-10-CM | POA: Diagnosis not present

## 2015-01-26 DIAGNOSIS — B349 Viral infection, unspecified: Secondary | ICD-10-CM | POA: Diagnosis not present

## 2015-01-26 DIAGNOSIS — D518 Other vitamin B12 deficiency anemias: Secondary | ICD-10-CM | POA: Diagnosis not present

## 2015-02-01 DIAGNOSIS — J019 Acute sinusitis, unspecified: Secondary | ICD-10-CM | POA: Diagnosis not present

## 2015-02-01 DIAGNOSIS — J309 Allergic rhinitis, unspecified: Secondary | ICD-10-CM | POA: Diagnosis not present

## 2015-03-23 DIAGNOSIS — D518 Other vitamin B12 deficiency anemias: Secondary | ICD-10-CM | POA: Diagnosis not present

## 2015-03-30 DIAGNOSIS — H6500 Acute serous otitis media, unspecified ear: Secondary | ICD-10-CM | POA: Diagnosis not present

## 2015-03-30 DIAGNOSIS — D519 Vitamin B12 deficiency anemia, unspecified: Secondary | ICD-10-CM | POA: Diagnosis not present

## 2015-03-30 DIAGNOSIS — J019 Acute sinusitis, unspecified: Secondary | ICD-10-CM | POA: Diagnosis not present

## 2015-04-20 DIAGNOSIS — D518 Other vitamin B12 deficiency anemias: Secondary | ICD-10-CM | POA: Diagnosis not present

## 2015-04-20 DIAGNOSIS — Z853 Personal history of malignant neoplasm of breast: Secondary | ICD-10-CM | POA: Diagnosis not present

## 2015-04-30 DIAGNOSIS — N281 Cyst of kidney, acquired: Secondary | ICD-10-CM | POA: Diagnosis not present

## 2015-04-30 DIAGNOSIS — N200 Calculus of kidney: Secondary | ICD-10-CM | POA: Diagnosis not present

## 2015-04-30 DIAGNOSIS — Z0000 Encounter for general adult medical examination without abnormal findings: Secondary | ICD-10-CM | POA: Diagnosis not present

## 2015-05-18 DIAGNOSIS — D518 Other vitamin B12 deficiency anemias: Secondary | ICD-10-CM | POA: Diagnosis not present

## 2015-05-18 DIAGNOSIS — Z853 Personal history of malignant neoplasm of breast: Secondary | ICD-10-CM | POA: Diagnosis not present

## 2015-06-19 DIAGNOSIS — D518 Other vitamin B12 deficiency anemias: Secondary | ICD-10-CM | POA: Diagnosis not present

## 2015-06-19 DIAGNOSIS — Z853 Personal history of malignant neoplasm of breast: Secondary | ICD-10-CM | POA: Diagnosis not present

## 2015-06-21 DIAGNOSIS — E039 Hypothyroidism, unspecified: Secondary | ICD-10-CM | POA: Diagnosis not present

## 2015-06-21 DIAGNOSIS — Z79899 Other long term (current) drug therapy: Secondary | ICD-10-CM | POA: Diagnosis not present

## 2015-06-21 DIAGNOSIS — Z800 Family history of malignant neoplasm of digestive organs: Secondary | ICD-10-CM | POA: Diagnosis not present

## 2015-06-21 DIAGNOSIS — K219 Gastro-esophageal reflux disease without esophagitis: Secondary | ICD-10-CM | POA: Diagnosis not present

## 2015-07-02 DIAGNOSIS — Z1211 Encounter for screening for malignant neoplasm of colon: Secondary | ICD-10-CM | POA: Diagnosis not present

## 2015-07-13 DIAGNOSIS — D518 Other vitamin B12 deficiency anemias: Secondary | ICD-10-CM | POA: Diagnosis not present

## 2015-08-10 DIAGNOSIS — D518 Other vitamin B12 deficiency anemias: Secondary | ICD-10-CM | POA: Diagnosis not present

## 2015-09-07 DIAGNOSIS — D518 Other vitamin B12 deficiency anemias: Secondary | ICD-10-CM | POA: Diagnosis not present

## 2015-10-05 DIAGNOSIS — D518 Other vitamin B12 deficiency anemias: Secondary | ICD-10-CM | POA: Diagnosis not present

## 2015-10-22 DIAGNOSIS — R0789 Other chest pain: Secondary | ICD-10-CM | POA: Diagnosis not present

## 2015-10-22 DIAGNOSIS — R300 Dysuria: Secondary | ICD-10-CM | POA: Diagnosis not present

## 2015-11-02 DIAGNOSIS — D518 Other vitamin B12 deficiency anemias: Secondary | ICD-10-CM | POA: Diagnosis not present

## 2015-11-11 DIAGNOSIS — H81399 Other peripheral vertigo, unspecified ear: Secondary | ICD-10-CM | POA: Diagnosis not present

## 2015-11-30 DIAGNOSIS — D518 Other vitamin B12 deficiency anemias: Secondary | ICD-10-CM | POA: Diagnosis not present

## 2015-12-28 DIAGNOSIS — D518 Other vitamin B12 deficiency anemias: Secondary | ICD-10-CM | POA: Diagnosis not present

## 2015-12-28 DIAGNOSIS — Z853 Personal history of malignant neoplasm of breast: Secondary | ICD-10-CM | POA: Diagnosis not present

## 2016-01-21 ENCOUNTER — Other Ambulatory Visit: Payer: Self-pay | Admitting: Oncology

## 2016-01-21 DIAGNOSIS — Z1231 Encounter for screening mammogram for malignant neoplasm of breast: Secondary | ICD-10-CM

## 2016-02-01 DIAGNOSIS — Z853 Personal history of malignant neoplasm of breast: Secondary | ICD-10-CM | POA: Diagnosis not present

## 2016-02-13 ENCOUNTER — Encounter: Payer: Self-pay | Admitting: Genetic Counselor

## 2016-02-13 ENCOUNTER — Telehealth: Payer: Self-pay | Admitting: Genetic Counselor

## 2016-02-13 NOTE — Telephone Encounter (Signed)
Patient confirmed appt, verified demo and insurance, mailed pt letter, faxed referring provider appt date/time.

## 2016-02-20 DIAGNOSIS — Z760 Encounter for issue of repeat prescription: Secondary | ICD-10-CM | POA: Diagnosis not present

## 2016-02-20 DIAGNOSIS — R8299 Other abnormal findings in urine: Secondary | ICD-10-CM | POA: Diagnosis not present

## 2016-02-22 ENCOUNTER — Ambulatory Visit
Admission: RE | Admit: 2016-02-22 | Discharge: 2016-02-22 | Disposition: A | Discharge status: Home / Self Care | Payer: Medicare Other | Source: Ambulatory Visit | Attending: Oncology | Admitting: Oncology

## 2016-02-22 DIAGNOSIS — Z1231 Encounter for screening mammogram for malignant neoplasm of breast: Secondary | ICD-10-CM

## 2016-02-29 DIAGNOSIS — Z853 Personal history of malignant neoplasm of breast: Secondary | ICD-10-CM | POA: Diagnosis not present

## 2016-02-29 DIAGNOSIS — D518 Other vitamin B12 deficiency anemias: Secondary | ICD-10-CM | POA: Diagnosis not present

## 2016-03-28 DIAGNOSIS — Z853 Personal history of malignant neoplasm of breast: Secondary | ICD-10-CM | POA: Diagnosis not present

## 2016-03-28 DIAGNOSIS — D518 Other vitamin B12 deficiency anemias: Secondary | ICD-10-CM | POA: Diagnosis not present

## 2016-04-23 ENCOUNTER — Ambulatory Visit (HOSPITAL_BASED_OUTPATIENT_CLINIC_OR_DEPARTMENT_OTHER): Payer: Medicare Other | Admitting: Genetic Counselor

## 2016-04-23 ENCOUNTER — Other Ambulatory Visit: Payer: Medicare Other

## 2016-04-23 ENCOUNTER — Encounter: Payer: Self-pay | Admitting: Genetic Counselor

## 2016-04-23 DIAGNOSIS — Z803 Family history of malignant neoplasm of breast: Secondary | ICD-10-CM | POA: Diagnosis not present

## 2016-04-23 DIAGNOSIS — Z853 Personal history of malignant neoplasm of breast: Secondary | ICD-10-CM | POA: Insufficient documentation

## 2016-04-23 DIAGNOSIS — Z800 Family history of malignant neoplasm of digestive organs: Secondary | ICD-10-CM | POA: Insufficient documentation

## 2016-04-23 DIAGNOSIS — Z315 Encounter for genetic counseling: Secondary | ICD-10-CM

## 2016-04-23 DIAGNOSIS — Z801 Family history of malignant neoplasm of trachea, bronchus and lung: Secondary | ICD-10-CM

## 2016-04-23 DIAGNOSIS — Z8051 Family history of malignant neoplasm of kidney: Secondary | ICD-10-CM | POA: Insufficient documentation

## 2016-04-23 DIAGNOSIS — C50911 Malignant neoplasm of unspecified site of right female breast: Secondary | ICD-10-CM | POA: Diagnosis not present

## 2016-04-23 NOTE — Progress Notes (Signed)
REFERRING PROVIDER: Derwood Kaplan, MD [place], [place] [address]    PRIMARY PROVIDER:  Pcp Not In System  PRIMARY REASON FOR VISIT:  1. Personal history of breast cancer   2. Family history of breast cancer   3. Family history of kidney cancer   4. Family history of stomach cancer      HISTORY OF PRESENT ILLNESS:   [name], a 71 y.o. female, was seen for a [facility] cancer genetics consultation at the request of Dr. Hinton Rao due to a personal and family history of cancer.  Ms. Aull presents to clinic today to discuss the possibility of a hereditary predisposition to cancer, genetic testing, and to further clarify her future cancer risks, as well as potential cancer risks for family members.   In 1997, at the age of 42, Ms. Castronovo was diagnosed with cancer of the right breast. This was treated with lumpectomy.  In 2007, at the age of 66, Ms. Mccaig was diagnosed with cancer of the right breast. This was triple positive, and was treated with a lumpectomy, chemotherapy and radiation.      CANCER HISTORY:   No history exists.     HORMONAL RISK FACTORS:  Menarche was at age 16.  First live birth at age 47.  OCP use for approximately <5 years.  Ovaries intact: yes.  Hysterectomy: yes.  Menopausal status: postmenopausal.  HRT use: for a couple years. Colonoscopy: yes; 2-3 poyps. Mammogram within the last year: yes. Number of breast biopsies: 2. Up to date with pelvic exams:  yes. Any excessive radiation exposure in the past:  no  Past Medical History:  Diagnosis Date  . Family history of breast cancer   . Family history of kidney cancer   . Family history of stomach cancer   . Personal history of breast cancer     History reviewed. No pertinent surgical history.  Social History   Social History  . Marital status: Married    Spouse name: N/A  . Number of children: N/A  . Years of education: N/A   Social History Main Topics  . Smoking  status: Never Smoker  . Smokeless tobacco: Never Used  . Alcohol use No  . Drug use: No  . Sexual activity: Not Asked   Other Topics Concern  . None   Social History Narrative  . None     FAMILY HISTORY:  We obtained a detailed, 4-generation family history.  Significant diagnoses are listed below: Family History  Problem Relation Age of Onset  . Kidney cancer Mother     dx in her 62s  . Congestive Heart Failure Father   . Lung cancer Brother     smoker  . Cirrhosis Maternal Aunt   . Cirrhosis Maternal Uncle   . Stomach cancer Maternal Grandmother     dx in her 5s  . Cirrhosis Maternal Uncle   . Breast cancer Cousin 25    maternal first cousin  . Breast cancer Cousin     dx in her 30s; maternal first cousin  . Breast cancer Cousin     dx in her 76s, maternal first cousin    The patient has two daughters who are cancer free.  She had three brothers.  One died as an infant, another died of lung cancer and the third died of complications from diabetes.  Her mother was diagnosed with kidney cancer in her 37's.  She was one of 17 siblings.  Three siblings  had cirrhosis of the liver, but none had cancer.  Three maternal cousins were diagnosed with breast cancer, one at 47 and two others in their 80's.  The patient's maternal grandmother had 'stomach' cancer and died at 87.   The patient's father died of heart failure at 50.  He had 10 siblings who did not have cancer.  There is no other reported family history of cancer.  Ms. Wardrop is unaware of previous family history of genetic testing for hereditary cancer risks. Patient's maternal ancestors are of Caucasian descent, and paternal ancestors are of Caucasian descent. There is no reported Ashkenazi Jewish ancestry. There is no known consanguinity.  GENETIC COUNSELING ASSESSMENT: CASSIDEE Black is a 71 y.o. female with a personal and family history of breast cancer which is somewhat suggestive of a hereditary cancer syndrome and  predisposition to cancer. We, therefore, discussed and recommended the following at today's visit.   DISCUSSION: We discussed that about 5-10% of breast cancer is hereditary with most cases due to BRCA mutations.  We discussed that there are other genes associated with hereditary cancers syndromes, including those for breast and kidney and stomach cancers.  We reviewed the characteristics, features and inheritance patterns of hereditary cancer syndromes. We also discussed genetic testing, including the appropriate family members to test, the process of testing, insurance coverage and turn-around-time for results. We discussed the implications of a negative, positive and/or variant of uncertain significant result. We recommended Ms. Armenteros pursue genetic testing for the [facility] panel. The Multi-Gene Panel offered by [facility] includes sequencing and/or deletion duplication testing of the following 80 genes: ALK, APC, ATM, AXIN2,BAP1,  BARD1, BLM, BMPR1A, BRCA1, BRCA2, BRIP1, CASR, CDC73, CDH1, CDK4, CDKN1B, CDKN1C, CDKN2A (p14ARF), CDKN2A (p16INK4a), CEBPA, CHEK2, DICER1, CIS3L2, EGFR (c.2369C>T, p.Thr790Met variant only), EPCAM (Deletion/duplication testing only), FH, FLCN, GATA2, GPC3, GREM1 (Promoter region deletion/duplication testing only), HOXB13 (c.251G>A, p.Gly84Glu), HRAS, KIT, MAX, MEN1, MET, MITF (c.952G>A, p.Glu318Lys variant only), MLH1, MSH2, MSH6, MUTYH, NBN, NF1, NF2, PALB2, PDGFRA, PHOX2B, PMS2, POLD1, POLE, POT1, PRKAR1A, PTCH1, PTEN, RAD50, RAD51C, RAD51D, RB1, RECQL4, RET, RUNX1, SDHAF2, SDHA (sequence changes only), SDHB, SDHC, SDHD, SMAD4, SMARCA4, SMARCB1, SMARCE1, STK11, SUFU, TERT, TERT, TMEM127, TP53, TSC1, TSC2, VHL, WRN and WT1.    Based on Ms. Kulakowski's personal and family history of cancer, she meets medical criteria for genetic testing. Despite that she meets criteria, she may still have an out of pocket cost. We discussed that if her out of pocket cost for testing is over $100, the  laboratory will call and confirm whether she wants to proceed with testing.  If the out of pocket cost of testing is less than $100 she will be billed by the genetic testing laboratory.   PLAN: After considering the risks, benefits, and limitations, Ms. Finks  provided informed consent to pursue genetic testing and the blood sample was sent to [facility] for analysis of the Multi-gene panel. Results should be available within approximately 2-3 weeks' time, at which point they will be disclosed by telephone to Ms. Hoffart, as will any additional recommendations warranted by these results. Ms. Mcdowell will receive a summary of her genetic counseling visit and a copy of her results once available. This information will also be available in Epic. We encouraged Ms. Gwynne to remain in contact with cancer genetics annually so that we can continuously update the family history and inform her of any changes in cancer genetics and testing that may be of benefit for her family. Ms. Gaulin questions  were answered to her satisfaction today. Our contact information was provided should additional questions or concerns arise.  Lastly, we encouraged Ms. Truxillo to remain in contact with cancer genetics annually so that we can continuously update the family history and inform her of any changes in cancer genetics and testing that may be of benefit for this family.   Ms.  Forton questions were answered to her satisfaction today. Our contact information was provided should additional questions or concerns arise. Thank you for the referral and allowing [place] to share in the care of your patient.   Tannah Dreyfuss P. Florene Glen, [place], [facility] Certified Genetic Counselor [email or web address] phone: [phone]  The patient was seen for a total of 60 minutes in face-to-face genetic counseling.  This patient was discussed with Drs. Magrinat, Lindi Adie and/or Burr Medico who agrees with the above.     _______________________________________________________________________ For Office Staff:  Number of people involved in session: 1 Was an Intern/ student involved with case: no

## 2016-04-25 DIAGNOSIS — D518 Other vitamin B12 deficiency anemias: Secondary | ICD-10-CM | POA: Diagnosis not present

## 2016-05-05 ENCOUNTER — Telehealth: Payer: Self-pay | Admitting: Genetic Counselor

## 2016-05-05 ENCOUNTER — Encounter: Payer: Self-pay | Admitting: Genetic Counselor

## 2016-05-05 DIAGNOSIS — Z1379 Encounter for other screening for genetic and chromosomal anomalies: Secondary | ICD-10-CM | POA: Insufficient documentation

## 2016-05-05 NOTE — Telephone Encounter (Signed)
Revealed negative genetic testing.  Discussed that we do not know why she has breast cancer or why there is cancer in the family. It could be due to a different gene that we are not testing, or maybe our current technology may not be able to pick something up.  It will be important for her to keep in contact with genetics to keep up with whether additional testing may be needed. 

## 2016-05-06 ENCOUNTER — Encounter: Payer: Self-pay | Admitting: Genetic Counselor

## 2016-05-06 ENCOUNTER — Ambulatory Visit: Payer: Self-pay | Admitting: Genetic Counselor

## 2016-05-06 DIAGNOSIS — Z803 Family history of malignant neoplasm of breast: Secondary | ICD-10-CM

## 2016-05-06 DIAGNOSIS — Z853 Personal history of malignant neoplasm of breast: Secondary | ICD-10-CM

## 2016-05-06 DIAGNOSIS — Z8051 Family history of malignant neoplasm of kidney: Secondary | ICD-10-CM

## 2016-05-06 DIAGNOSIS — Z800 Family history of malignant neoplasm of digestive organs: Secondary | ICD-10-CM

## 2016-05-06 DIAGNOSIS — Z1379 Encounter for other screening for genetic and chromosomal anomalies: Secondary | ICD-10-CM

## 2016-05-06 NOTE — Progress Notes (Signed)
HPI: Marissa Black was previously seen in the [place] clinic due to a personal and family history of cancer and concerns regarding a hereditary predisposition to cancer. Please refer to our prior cancer genetics clinic note for more information regarding Marissa Black's medical, social and family histories, and our assessment and recommendations, at the time. Marissa Black recent genetic test results were disclosed to her, as were recommendations warranted by these results. These results and recommendations are discussed in more detail below.  CANCER HISTORY:   No history exists.    FAMILY HISTORY:  We obtained a detailed, 4-generation family history.  Significant diagnoses are listed below: Family History  Problem Relation Age of Onset  . Kidney cancer Mother     dx in her 60s  . Congestive Heart Failure Father   . Lung cancer Brother     smoker  . Cirrhosis Maternal Aunt   . Cirrhosis Maternal Uncle   . Stomach cancer Maternal Grandmother     dx in her 64s  . Cirrhosis Maternal Uncle   . Breast cancer Cousin 25    maternal first cousin  . Breast cancer Cousin     dx in her 8s; maternal first cousin  . Breast cancer Cousin     dx in her 58s, maternal first cousin    The patient has two daughters who are cancer free.  She had three brothers.  One died as an infant, another died of lung cancer and the third died of complications from diabetes.  Her mother was diagnosed with kidney cancer in her 19's.  She was one of 17 siblings.  Three siblings had cirrhosis of the liver, but none had cancer.  Three maternal cousins were diagnosed with breast cancer, one at 44 and two others in their 52's.  The patient's maternal grandmother had 'stomach' cancer and died at 23.   The patient's father died of heart failure at 87.  He had 10 siblings who did not have cancer.  There is no other reported family history of cancer.  Marissa Black is unaware of previous family history of genetic  testing for hereditary cancer risks. Patient's maternal ancestors are of Caucasian descent, and paternal ancestors are of Caucasian descent. There is no reported Ashkenazi Jewish ancestry. There is no known consanguinity.  GENETIC TEST RESULTS: Genetic testing reported out on May 03, 2016 through the Multi-cancer panel found no deleterious mutations.  The Multi-Gene Panel offered by [facility] includes sequencing and/or deletion duplication testing of the following 80 genes: ALK, APC, ATM, AXIN2,BAP1,  BARD1, BLM, BMPR1A, BRCA1, BRCA2, BRIP1, CASR, CDC73, CDH1, CDK4, CDKN1B, CDKN1C, CDKN2A (p14ARF), CDKN2A (p16INK4a), CEBPA, CHEK2, DICER1, CIS3L2, EGFR (c.2369C>T, p.Thr790Met variant only), EPCAM (Deletion/duplication testing only), FH, FLCN, GATA2, GPC3, GREM1 (Promoter region deletion/duplication testing only), HOXB13 (c.251G>A, p.Gly84Glu), HRAS, KIT, MAX, MEN1, MET, MITF (c.952G>A, p.Glu318Lys variant only), MLH1, MSH2, MSH6, MUTYH, NBN, NF1, NF2, PALB2, PDGFRA, PHOX2B, PMS2, POLD1, POLE, POT1, PRKAR1A, PTCH1, PTEN, RAD50, RAD51C, RAD51D, RB1, RECQL4, RET, RUNX1, SDHAF2, SDHA (sequence changes only), SDHB, SDHC, SDHD, SMAD4, SMARCA4, SMARCB1, SMARCE1, STK11, SUFU, TERT, TERT, TMEM127, TP53, TSC1, TSC2, VHL, WRN and WT1.    The test report has been scanned into EPIC and is located under the Molecular Pathology section of the Results Review tab.   We discussed with Marissa Black that since the current genetic testing is not perfect, it is possible there may be a gene mutation in one of these genes that current testing cannot detect, but that  chance is small. We also discussed, that it is possible that another gene that has not yet been discovered, or that we have not yet tested, is responsible for the cancer diagnoses in the family, and it is, therefore, important to remain in touch with cancer genetics in the future so that we can continue to offer Marissa Black the most up to date genetic testing.       CANCER  SCREENING RECOMMENDATIONS: This result is reassuring and indicates that Marissa Black likely does not have an increased risk for a future cancer due to a mutation in one of these genes. This normal test also suggests that Marissa Black's cancer was most likely not due to an inherited predisposition associated with one of these genes.  Most cancers happen by chance and this negative test suggests that her cancer falls into this category.  We, therefore, recommended she continue to follow the cancer management and screening guidelines provided by her oncology and primary healthcare provider.   RECOMMENDATIONS FOR FAMILY MEMBERS: Women in this family might be at some increased risk of developing cancer, over the general population risk, simply due to the family history of cancer. We recommended women in this family have a yearly mammogram beginning at age 63, or 29 years younger than the earliest onset of cancer, an annual clinical breast exam, and perform monthly breast self-exams. Women in this family should also have a gynecological exam as recommended by their primary provider. All family members should have a colonoscopy by age 68.  Based on Ms. Cappucci's family history, we recommended her maternal cousin, who was diagnosed with breast in their 83's, have genetic counseling and testing. Marissa Black will let us know if we can be of any assistance in coordinating genetic counseling and/or testing for this family member.   FOLLOW-UP: Lastly, we discussed with Marissa Black that cancer genetics is a rapidly advancing field and it is possible that new genetic tests will be appropriate for her and/or her family members in the future. We encouraged her to remain in contact with cancer genetics on an annual basis so we can update her personal and family histories and let her know of advances in cancer genetics that may benefit this family.   Our contact number was provided. Marissa Black questions were answered to her  satisfaction, and she knows she is welcome to call us at anytime with additional questions or concerns.   Roma Kayser, MS, [facility] Certified Genetic Counselor [email or web address]

## 2016-05-12 DIAGNOSIS — Z6827 Body mass index (BMI) 27.0-27.9, adult: Secondary | ICD-10-CM | POA: Diagnosis not present

## 2016-05-12 DIAGNOSIS — R300 Dysuria: Secondary | ICD-10-CM | POA: Diagnosis not present

## 2016-05-23 DIAGNOSIS — D518 Other vitamin B12 deficiency anemias: Secondary | ICD-10-CM | POA: Diagnosis not present

## 2016-05-23 DIAGNOSIS — Z6827 Body mass index (BMI) 27.0-27.9, adult: Secondary | ICD-10-CM | POA: Diagnosis not present

## 2016-05-23 DIAGNOSIS — H6504 Acute serous otitis media, recurrent, right ear: Secondary | ICD-10-CM | POA: Diagnosis not present

## 2016-05-23 DIAGNOSIS — N3001 Acute cystitis with hematuria: Secondary | ICD-10-CM | POA: Diagnosis not present

## 2016-05-23 DIAGNOSIS — Z853 Personal history of malignant neoplasm of breast: Secondary | ICD-10-CM | POA: Diagnosis not present

## 2016-05-26 DIAGNOSIS — J309 Allergic rhinitis, unspecified: Secondary | ICD-10-CM | POA: Diagnosis not present

## 2016-05-26 DIAGNOSIS — N342 Other urethritis: Secondary | ICD-10-CM | POA: Diagnosis not present

## 2016-06-05 ENCOUNTER — Encounter (HOSPITAL_COMMUNITY): Payer: Self-pay

## 2016-06-20 DIAGNOSIS — D518 Other vitamin B12 deficiency anemias: Secondary | ICD-10-CM | POA: Diagnosis not present

## 2016-07-02 DIAGNOSIS — Z6825 Body mass index (BMI) 25.0-25.9, adult: Secondary | ICD-10-CM | POA: Diagnosis not present

## 2016-07-02 DIAGNOSIS — M47812 Spondylosis without myelopathy or radiculopathy, cervical region: Secondary | ICD-10-CM | POA: Diagnosis not present

## 2016-07-02 DIAGNOSIS — R51 Headache: Secondary | ICD-10-CM | POA: Diagnosis not present

## 2016-07-02 DIAGNOSIS — R300 Dysuria: Secondary | ICD-10-CM | POA: Diagnosis not present

## 2016-07-18 DIAGNOSIS — D518 Other vitamin B12 deficiency anemias: Secondary | ICD-10-CM | POA: Diagnosis not present

## 2016-08-15 DIAGNOSIS — D518 Other vitamin B12 deficiency anemias: Secondary | ICD-10-CM | POA: Diagnosis not present

## 2016-08-15 DIAGNOSIS — Z853 Personal history of malignant neoplasm of breast: Secondary | ICD-10-CM | POA: Diagnosis not present

## 2016-09-02 ENCOUNTER — Other Ambulatory Visit: Payer: Self-pay

## 2016-09-12 DIAGNOSIS — D518 Other vitamin B12 deficiency anemias: Secondary | ICD-10-CM | POA: Diagnosis not present

## 2016-09-21 DIAGNOSIS — N3090 Cystitis, unspecified without hematuria: Secondary | ICD-10-CM | POA: Diagnosis not present

## 2016-09-21 DIAGNOSIS — N3001 Acute cystitis with hematuria: Secondary | ICD-10-CM | POA: Diagnosis not present

## 2016-09-21 DIAGNOSIS — J069 Acute upper respiratory infection, unspecified: Secondary | ICD-10-CM | POA: Diagnosis not present

## 2016-09-23 DIAGNOSIS — N3001 Acute cystitis with hematuria: Secondary | ICD-10-CM | POA: Diagnosis not present

## 2016-09-23 DIAGNOSIS — Z6825 Body mass index (BMI) 25.0-25.9, adult: Secondary | ICD-10-CM | POA: Diagnosis not present

## 2016-09-23 DIAGNOSIS — M542 Cervicalgia: Secondary | ICD-10-CM | POA: Diagnosis not present

## 2016-09-23 DIAGNOSIS — G44209 Tension-type headache, unspecified, not intractable: Secondary | ICD-10-CM | POA: Diagnosis not present

## 2016-09-25 DIAGNOSIS — R7982 Elevated C-reactive protein (CRP): Secondary | ICD-10-CM | POA: Diagnosis not present

## 2016-09-25 DIAGNOSIS — R531 Weakness: Secondary | ICD-10-CM | POA: Diagnosis not present

## 2016-09-25 DIAGNOSIS — H9209 Otalgia, unspecified ear: Secondary | ICD-10-CM | POA: Diagnosis not present

## 2016-09-25 DIAGNOSIS — Z853 Personal history of malignant neoplasm of breast: Secondary | ICD-10-CM | POA: Diagnosis not present

## 2016-09-25 DIAGNOSIS — M5412 Radiculopathy, cervical region: Secondary | ICD-10-CM | POA: Diagnosis not present

## 2016-09-29 DIAGNOSIS — N289 Disorder of kidney and ureter, unspecified: Secondary | ICD-10-CM | POA: Diagnosis not present

## 2016-10-10 DIAGNOSIS — D518 Other vitamin B12 deficiency anemias: Secondary | ICD-10-CM | POA: Diagnosis not present

## 2016-10-10 DIAGNOSIS — Z853 Personal history of malignant neoplasm of breast: Secondary | ICD-10-CM | POA: Diagnosis not present

## 2016-12-05 DIAGNOSIS — D518 Other vitamin B12 deficiency anemias: Secondary | ICD-10-CM | POA: Diagnosis not present

## 2017-01-30 DIAGNOSIS — D518 Other vitamin B12 deficiency anemias: Secondary | ICD-10-CM | POA: Diagnosis not present

## 2017-01-30 DIAGNOSIS — Z853 Personal history of malignant neoplasm of breast: Secondary | ICD-10-CM | POA: Diagnosis not present

## 2017-01-30 DIAGNOSIS — J0100 Acute maxillary sinusitis, unspecified: Secondary | ICD-10-CM | POA: Diagnosis not present

## 2017-02-19 ENCOUNTER — Other Ambulatory Visit: Payer: Self-pay | Admitting: Oncology

## 2017-02-19 DIAGNOSIS — Z1231 Encounter for screening mammogram for malignant neoplasm of breast: Secondary | ICD-10-CM

## 2017-02-27 DIAGNOSIS — E559 Vitamin D deficiency, unspecified: Secondary | ICD-10-CM | POA: Diagnosis not present

## 2017-02-27 DIAGNOSIS — Z853 Personal history of malignant neoplasm of breast: Secondary | ICD-10-CM | POA: Diagnosis not present

## 2017-02-27 DIAGNOSIS — E538 Deficiency of other specified B group vitamins: Secondary | ICD-10-CM | POA: Diagnosis not present

## 2017-03-02 DIAGNOSIS — Z853 Personal history of malignant neoplasm of breast: Secondary | ICD-10-CM | POA: Diagnosis not present

## 2017-03-02 DIAGNOSIS — D518 Other vitamin B12 deficiency anemias: Secondary | ICD-10-CM | POA: Diagnosis not present

## 2017-03-25 ENCOUNTER — Ambulatory Visit
Admission: RE | Admit: 2017-03-25 | Discharge: 2017-03-25 | Disposition: A | Discharge status: Home / Self Care | Payer: PPO | Source: Ambulatory Visit | Attending: Oncology | Admitting: Oncology

## 2017-03-25 DIAGNOSIS — Z1231 Encounter for screening mammogram for malignant neoplasm of breast: Secondary | ICD-10-CM

## 2017-03-25 HISTORY — DX: Personal history of antineoplastic chemotherapy: Z92.21

## 2017-03-25 HISTORY — DX: Personal history of irradiation: Z92.3

## 2017-03-30 DIAGNOSIS — M542 Cervicalgia: Secondary | ICD-10-CM | POA: Diagnosis not present

## 2017-03-30 DIAGNOSIS — N289 Disorder of kidney and ureter, unspecified: Secondary | ICD-10-CM | POA: Diagnosis not present

## 2017-03-30 DIAGNOSIS — Z6826 Body mass index (BMI) 26.0-26.9, adult: Secondary | ICD-10-CM | POA: Diagnosis not present

## 2017-03-30 DIAGNOSIS — E538 Deficiency of other specified B group vitamins: Secondary | ICD-10-CM | POA: Diagnosis not present

## 2017-03-30 DIAGNOSIS — R300 Dysuria: Secondary | ICD-10-CM | POA: Diagnosis not present

## 2017-06-03 DIAGNOSIS — E538 Deficiency of other specified B group vitamins: Secondary | ICD-10-CM | POA: Diagnosis not present

## 2017-06-22 DIAGNOSIS — D518 Other vitamin B12 deficiency anemias: Secondary | ICD-10-CM | POA: Diagnosis not present

## 2017-07-20 DIAGNOSIS — Z853 Personal history of malignant neoplasm of breast: Secondary | ICD-10-CM | POA: Diagnosis not present

## 2017-07-20 DIAGNOSIS — D518 Other vitamin B12 deficiency anemias: Secondary | ICD-10-CM | POA: Diagnosis not present

## 2017-08-17 DIAGNOSIS — D518 Other vitamin B12 deficiency anemias: Secondary | ICD-10-CM | POA: Diagnosis not present

## 2017-08-17 DIAGNOSIS — Z853 Personal history of malignant neoplasm of breast: Secondary | ICD-10-CM | POA: Diagnosis not present

## 2017-09-14 DIAGNOSIS — D518 Other vitamin B12 deficiency anemias: Secondary | ICD-10-CM | POA: Diagnosis not present

## 2017-09-14 DIAGNOSIS — Z853 Personal history of malignant neoplasm of breast: Secondary | ICD-10-CM | POA: Diagnosis not present

## 2017-10-06 DIAGNOSIS — Z853 Personal history of malignant neoplasm of breast: Secondary | ICD-10-CM | POA: Diagnosis not present

## 2017-10-06 DIAGNOSIS — Z6825 Body mass index (BMI) 25.0-25.9, adult: Secondary | ICD-10-CM | POA: Diagnosis not present

## 2017-10-06 DIAGNOSIS — M79671 Pain in right foot: Secondary | ICD-10-CM | POA: Diagnosis not present

## 2017-10-06 DIAGNOSIS — M25552 Pain in left hip: Secondary | ICD-10-CM | POA: Diagnosis not present

## 2017-10-06 DIAGNOSIS — Z0000 Encounter for general adult medical examination without abnormal findings: Secondary | ICD-10-CM | POA: Diagnosis not present

## 2017-10-06 DIAGNOSIS — Z1331 Encounter for screening for depression: Secondary | ICD-10-CM | POA: Diagnosis not present

## 2017-10-06 DIAGNOSIS — M549 Dorsalgia, unspecified: Secondary | ICD-10-CM | POA: Diagnosis not present

## 2017-10-06 DIAGNOSIS — Z1339 Encounter for screening examination for other mental health and behavioral disorders: Secondary | ICD-10-CM | POA: Diagnosis not present

## 2017-10-09 DIAGNOSIS — Z6825 Body mass index (BMI) 25.0-25.9, adult: Secondary | ICD-10-CM | POA: Diagnosis not present

## 2017-10-09 DIAGNOSIS — R42 Dizziness and giddiness: Secondary | ICD-10-CM | POA: Diagnosis not present

## 2017-10-14 DIAGNOSIS — D518 Other vitamin B12 deficiency anemias: Secondary | ICD-10-CM | POA: Diagnosis not present

## 2017-10-14 DIAGNOSIS — Z853 Personal history of malignant neoplasm of breast: Secondary | ICD-10-CM | POA: Diagnosis not present

## 2017-11-11 DIAGNOSIS — Z853 Personal history of malignant neoplasm of breast: Secondary | ICD-10-CM | POA: Diagnosis not present

## 2017-11-11 DIAGNOSIS — D518 Other vitamin B12 deficiency anemias: Secondary | ICD-10-CM | POA: Diagnosis not present

## 2017-12-09 DIAGNOSIS — Z853 Personal history of malignant neoplasm of breast: Secondary | ICD-10-CM | POA: Diagnosis not present

## 2018-01-12 DIAGNOSIS — D518 Other vitamin B12 deficiency anemias: Secondary | ICD-10-CM | POA: Diagnosis not present

## 2018-02-05 DIAGNOSIS — D519 Vitamin B12 deficiency anemia, unspecified: Secondary | ICD-10-CM | POA: Diagnosis not present

## 2018-02-05 DIAGNOSIS — Z853 Personal history of malignant neoplasm of breast: Secondary | ICD-10-CM | POA: Diagnosis not present

## 2018-02-26 DIAGNOSIS — E538 Deficiency of other specified B group vitamins: Secondary | ICD-10-CM | POA: Diagnosis not present

## 2018-02-26 DIAGNOSIS — Z853 Personal history of malignant neoplasm of breast: Secondary | ICD-10-CM | POA: Diagnosis not present

## 2018-02-26 DIAGNOSIS — E039 Hypothyroidism, unspecified: Secondary | ICD-10-CM | POA: Diagnosis not present

## 2018-03-05 DIAGNOSIS — Z853 Personal history of malignant neoplasm of breast: Secondary | ICD-10-CM | POA: Diagnosis not present

## 2018-03-05 DIAGNOSIS — C155 Malignant neoplasm of lower third of esophagus: Secondary | ICD-10-CM | POA: Diagnosis not present

## 2018-03-24 ENCOUNTER — Other Ambulatory Visit: Payer: Self-pay | Admitting: Oncology

## 2018-03-24 DIAGNOSIS — Z1231 Encounter for screening mammogram for malignant neoplasm of breast: Secondary | ICD-10-CM

## 2018-04-21 ENCOUNTER — Other Ambulatory Visit: Payer: Self-pay

## 2018-04-21 ENCOUNTER — Ambulatory Visit
Admission: RE | Admit: 2018-04-21 | Discharge: 2018-04-21 | Disposition: A | Discharge status: Home / Self Care | Payer: Medicare Other | Source: Ambulatory Visit | Attending: Oncology | Admitting: Oncology

## 2018-04-21 DIAGNOSIS — Z1231 Encounter for screening mammogram for malignant neoplasm of breast: Secondary | ICD-10-CM

## 2018-04-21 NOTE — Progress Notes (Signed)
These preliminary result these preliminary results were noted.  Awaiting final report.

## 2019-04-01 ENCOUNTER — Other Ambulatory Visit: Payer: Self-pay | Admitting: Oncology

## 2019-04-01 DIAGNOSIS — Z1231 Encounter for screening mammogram for malignant neoplasm of breast: Secondary | ICD-10-CM

## 2019-05-06 ENCOUNTER — Ambulatory Visit: Payer: Medicare Other

## 2019-05-27 ENCOUNTER — Other Ambulatory Visit: Payer: Self-pay

## 2019-05-27 ENCOUNTER — Ambulatory Visit
Admission: RE | Admit: 2019-05-27 | Discharge: 2019-05-27 | Disposition: A | Discharge status: Home / Self Care | Payer: Medicare Other | Source: Ambulatory Visit | Attending: Oncology | Admitting: Oncology

## 2019-05-27 DIAGNOSIS — Z1231 Encounter for screening mammogram for malignant neoplasm of breast: Secondary | ICD-10-CM

## 2019-07-15 DIAGNOSIS — Z853 Personal history of malignant neoplasm of breast: Secondary | ICD-10-CM | POA: Diagnosis not present

## 2020-03-05 DIAGNOSIS — E538 Deficiency of other specified B group vitamins: Secondary | ICD-10-CM | POA: Diagnosis not present

## 2020-03-16 DIAGNOSIS — N1330 Unspecified hydronephrosis: Secondary | ICD-10-CM | POA: Diagnosis not present

## 2020-03-16 DIAGNOSIS — R030 Elevated blood-pressure reading, without diagnosis of hypertension: Secondary | ICD-10-CM | POA: Diagnosis not present

## 2020-03-16 DIAGNOSIS — K5730 Diverticulosis of large intestine without perforation or abscess without bleeding: Secondary | ICD-10-CM | POA: Diagnosis not present

## 2020-03-16 DIAGNOSIS — R350 Frequency of micturition: Secondary | ICD-10-CM | POA: Diagnosis not present

## 2020-03-16 DIAGNOSIS — S72142A Displaced intertrochanteric fracture of left femur, initial encounter for closed fracture: Secondary | ICD-10-CM | POA: Diagnosis not present

## 2020-03-16 DIAGNOSIS — R52 Pain, unspecified: Secondary | ICD-10-CM | POA: Diagnosis not present

## 2020-03-16 DIAGNOSIS — N201 Calculus of ureter: Secondary | ICD-10-CM | POA: Diagnosis not present

## 2020-03-16 DIAGNOSIS — N390 Urinary tract infection, site not specified: Secondary | ICD-10-CM | POA: Diagnosis not present

## 2020-03-16 DIAGNOSIS — N3001 Acute cystitis with hematuria: Secondary | ICD-10-CM | POA: Diagnosis not present

## 2020-03-16 DIAGNOSIS — N200 Calculus of kidney: Secondary | ICD-10-CM | POA: Diagnosis not present

## 2020-03-16 DIAGNOSIS — R300 Dysuria: Secondary | ICD-10-CM | POA: Diagnosis not present

## 2020-03-16 DIAGNOSIS — N132 Hydronephrosis with renal and ureteral calculous obstruction: Secondary | ICD-10-CM | POA: Diagnosis not present

## 2020-03-16 DIAGNOSIS — I700 Atherosclerosis of aorta: Secondary | ICD-10-CM | POA: Diagnosis not present

## 2020-03-16 DIAGNOSIS — R109 Unspecified abdominal pain: Secondary | ICD-10-CM | POA: Diagnosis not present

## 2020-03-16 DIAGNOSIS — I10 Essential (primary) hypertension: Secondary | ICD-10-CM | POA: Diagnosis not present

## 2020-03-16 DIAGNOSIS — W19XXXA Unspecified fall, initial encounter: Secondary | ICD-10-CM | POA: Diagnosis not present

## 2020-03-16 DIAGNOSIS — R2243 Localized swelling, mass and lump, lower limb, bilateral: Secondary | ICD-10-CM | POA: Diagnosis not present

## 2020-03-16 DIAGNOSIS — J984 Other disorders of lung: Secondary | ICD-10-CM | POA: Diagnosis not present

## 2020-03-17 DIAGNOSIS — R109 Unspecified abdominal pain: Secondary | ICD-10-CM | POA: Diagnosis not present

## 2020-03-17 DIAGNOSIS — K5730 Diverticulosis of large intestine without perforation or abscess without bleeding: Secondary | ICD-10-CM | POA: Diagnosis not present

## 2020-03-17 DIAGNOSIS — R2243 Localized swelling, mass and lump, lower limb, bilateral: Secondary | ICD-10-CM | POA: Diagnosis not present

## 2020-03-17 DIAGNOSIS — S72142A Displaced intertrochanteric fracture of left femur, initial encounter for closed fracture: Secondary | ICD-10-CM | POA: Diagnosis not present

## 2020-03-17 DIAGNOSIS — Z7982 Long term (current) use of aspirin: Secondary | ICD-10-CM | POA: Diagnosis not present

## 2020-03-17 DIAGNOSIS — N1330 Unspecified hydronephrosis: Secondary | ICD-10-CM | POA: Diagnosis not present

## 2020-03-17 DIAGNOSIS — S72142 Displaced intertrochanteric fracture of left femur: Secondary | ICD-10-CM | POA: Diagnosis not present

## 2020-03-17 DIAGNOSIS — M1990 Unspecified osteoarthritis, unspecified site: Secondary | ICD-10-CM | POA: Diagnosis not present

## 2020-03-17 DIAGNOSIS — N200 Calculus of kidney: Secondary | ICD-10-CM | POA: Diagnosis not present

## 2020-03-17 DIAGNOSIS — J984 Other disorders of lung: Secondary | ICD-10-CM | POA: Diagnosis not present

## 2020-03-17 DIAGNOSIS — I10 Essential (primary) hypertension: Secondary | ICD-10-CM | POA: Diagnosis not present

## 2020-03-17 DIAGNOSIS — R350 Frequency of micturition: Secondary | ICD-10-CM | POA: Diagnosis not present

## 2020-03-17 DIAGNOSIS — N132 Hydronephrosis with renal and ureteral calculous obstruction: Secondary | ICD-10-CM | POA: Diagnosis not present

## 2020-03-17 DIAGNOSIS — Z79899 Other long term (current) drug therapy: Secondary | ICD-10-CM | POA: Diagnosis not present

## 2020-03-17 DIAGNOSIS — N390 Urinary tract infection, site not specified: Secondary | ICD-10-CM | POA: Diagnosis not present

## 2020-03-17 DIAGNOSIS — D62 Acute posthemorrhagic anemia: Secondary | ICD-10-CM | POA: Diagnosis not present

## 2020-03-17 DIAGNOSIS — N131 Hydronephrosis with ureteral stricture, not elsewhere classified: Secondary | ICD-10-CM | POA: Diagnosis not present

## 2020-03-17 DIAGNOSIS — W19XXXA Unspecified fall, initial encounter: Secondary | ICD-10-CM | POA: Diagnosis not present

## 2020-03-17 DIAGNOSIS — R030 Elevated blood-pressure reading, without diagnosis of hypertension: Secondary | ICD-10-CM | POA: Diagnosis not present

## 2020-03-17 DIAGNOSIS — S72142D Displaced intertrochanteric fracture of left femur, subsequent encounter for closed fracture with routine healing: Secondary | ICD-10-CM | POA: Diagnosis not present

## 2020-03-17 DIAGNOSIS — N201 Calculus of ureter: Secondary | ICD-10-CM | POA: Diagnosis not present

## 2020-03-17 DIAGNOSIS — I700 Atherosclerosis of aorta: Secondary | ICD-10-CM | POA: Diagnosis not present

## 2020-03-18 DIAGNOSIS — S72142A Displaced intertrochanteric fracture of left femur, initial encounter for closed fracture: Secondary | ICD-10-CM | POA: Diagnosis not present

## 2020-03-18 DIAGNOSIS — D62 Acute posthemorrhagic anemia: Secondary | ICD-10-CM | POA: Diagnosis not present

## 2020-03-18 DIAGNOSIS — N131 Hydronephrosis with ureteral stricture, not elsewhere classified: Secondary | ICD-10-CM | POA: Diagnosis not present

## 2020-03-19 DIAGNOSIS — S72142A Displaced intertrochanteric fracture of left femur, initial encounter for closed fracture: Secondary | ICD-10-CM | POA: Diagnosis not present

## 2020-03-19 DIAGNOSIS — N131 Hydronephrosis with ureteral stricture, not elsewhere classified: Secondary | ICD-10-CM | POA: Diagnosis not present

## 2020-03-19 DIAGNOSIS — D62 Acute posthemorrhagic anemia: Secondary | ICD-10-CM | POA: Diagnosis not present

## 2020-03-20 DIAGNOSIS — D62 Acute posthemorrhagic anemia: Secondary | ICD-10-CM | POA: Diagnosis not present

## 2020-03-20 DIAGNOSIS — N131 Hydronephrosis with ureteral stricture, not elsewhere classified: Secondary | ICD-10-CM | POA: Diagnosis not present

## 2020-03-20 DIAGNOSIS — S72142A Displaced intertrochanteric fracture of left femur, initial encounter for closed fracture: Secondary | ICD-10-CM | POA: Diagnosis not present

## 2020-03-21 DIAGNOSIS — D62 Acute posthemorrhagic anemia: Secondary | ICD-10-CM | POA: Diagnosis not present

## 2020-03-21 DIAGNOSIS — S72142A Displaced intertrochanteric fracture of left femur, initial encounter for closed fracture: Secondary | ICD-10-CM | POA: Diagnosis not present

## 2020-03-21 DIAGNOSIS — N131 Hydronephrosis with ureteral stricture, not elsewhere classified: Secondary | ICD-10-CM | POA: Diagnosis not present

## 2020-03-22 DIAGNOSIS — Z853 Personal history of malignant neoplasm of breast: Secondary | ICD-10-CM | POA: Diagnosis not present

## 2020-03-22 DIAGNOSIS — Z4789 Encounter for other orthopedic aftercare: Secondary | ICD-10-CM | POA: Diagnosis not present

## 2020-03-22 DIAGNOSIS — I10 Essential (primary) hypertension: Secondary | ICD-10-CM | POA: Diagnosis not present

## 2020-03-22 DIAGNOSIS — Z7982 Long term (current) use of aspirin: Secondary | ICD-10-CM | POA: Diagnosis not present

## 2020-03-22 DIAGNOSIS — N131 Hydronephrosis with ureteral stricture, not elsewhere classified: Secondary | ICD-10-CM | POA: Diagnosis not present

## 2020-03-22 DIAGNOSIS — K219 Gastro-esophageal reflux disease without esophagitis: Secondary | ICD-10-CM | POA: Diagnosis not present

## 2020-03-22 DIAGNOSIS — Z9181 History of falling: Secondary | ICD-10-CM | POA: Diagnosis not present

## 2020-03-22 DIAGNOSIS — S72142D Displaced intertrochanteric fracture of left femur, subsequent encounter for closed fracture with routine healing: Secondary | ICD-10-CM | POA: Diagnosis not present

## 2020-03-22 DIAGNOSIS — M1990 Unspecified osteoarthritis, unspecified site: Secondary | ICD-10-CM | POA: Diagnosis not present

## 2020-03-22 DIAGNOSIS — N200 Calculus of kidney: Secondary | ICD-10-CM | POA: Diagnosis not present

## 2020-03-26 DIAGNOSIS — R531 Weakness: Secondary | ICD-10-CM | POA: Diagnosis not present

## 2020-04-21 DIAGNOSIS — Z853 Personal history of malignant neoplasm of breast: Secondary | ICD-10-CM | POA: Diagnosis not present

## 2020-04-21 DIAGNOSIS — I10 Essential (primary) hypertension: Secondary | ICD-10-CM | POA: Diagnosis not present

## 2020-04-21 DIAGNOSIS — Z7982 Long term (current) use of aspirin: Secondary | ICD-10-CM | POA: Diagnosis not present

## 2020-04-21 DIAGNOSIS — S72142D Displaced intertrochanteric fracture of left femur, subsequent encounter for closed fracture with routine healing: Secondary | ICD-10-CM | POA: Diagnosis not present

## 2020-04-21 DIAGNOSIS — Z9181 History of falling: Secondary | ICD-10-CM | POA: Diagnosis not present

## 2020-04-21 DIAGNOSIS — N200 Calculus of kidney: Secondary | ICD-10-CM | POA: Diagnosis not present

## 2020-04-21 DIAGNOSIS — K219 Gastro-esophageal reflux disease without esophagitis: Secondary | ICD-10-CM | POA: Diagnosis not present

## 2020-04-21 DIAGNOSIS — N131 Hydronephrosis with ureteral stricture, not elsewhere classified: Secondary | ICD-10-CM | POA: Diagnosis not present

## 2020-04-21 DIAGNOSIS — M1990 Unspecified osteoarthritis, unspecified site: Secondary | ICD-10-CM | POA: Diagnosis not present

## 2020-04-26 DIAGNOSIS — E538 Deficiency of other specified B group vitamins: Secondary | ICD-10-CM | POA: Diagnosis not present

## 2020-04-26 DIAGNOSIS — Z8781 Personal history of (healed) traumatic fracture: Secondary | ICD-10-CM | POA: Diagnosis not present

## 2020-04-26 DIAGNOSIS — I10 Essential (primary) hypertension: Secondary | ICD-10-CM | POA: Diagnosis not present

## 2020-04-26 DIAGNOSIS — Z780 Asymptomatic menopausal state: Secondary | ICD-10-CM | POA: Diagnosis not present

## 2020-04-26 DIAGNOSIS — I672 Cerebral atherosclerosis: Secondary | ICD-10-CM | POA: Diagnosis not present

## 2020-04-26 DIAGNOSIS — D500 Iron deficiency anemia secondary to blood loss (chronic): Secondary | ICD-10-CM | POA: Diagnosis not present

## 2020-05-02 DIAGNOSIS — M810 Age-related osteoporosis without current pathological fracture: Secondary | ICD-10-CM | POA: Diagnosis not present

## 2020-05-02 DIAGNOSIS — Z780 Asymptomatic menopausal state: Secondary | ICD-10-CM | POA: Diagnosis not present

## 2020-05-02 LAB — HM DEXA SCAN

## 2020-05-03 DIAGNOSIS — R350 Frequency of micturition: Secondary | ICD-10-CM | POA: Diagnosis not present

## 2020-05-15 DIAGNOSIS — Z8781 Personal history of (healed) traumatic fracture: Secondary | ICD-10-CM | POA: Diagnosis not present

## 2020-05-15 DIAGNOSIS — Z9889 Other specified postprocedural states: Secondary | ICD-10-CM | POA: Diagnosis not present

## 2020-05-15 DIAGNOSIS — S72143A Displaced intertrochanteric fracture of unspecified femur, initial encounter for closed fracture: Secondary | ICD-10-CM | POA: Diagnosis not present

## 2020-05-15 DIAGNOSIS — S72142D Displaced intertrochanteric fracture of left femur, subsequent encounter for closed fracture with routine healing: Secondary | ICD-10-CM | POA: Diagnosis not present

## 2020-06-07 DIAGNOSIS — E538 Deficiency of other specified B group vitamins: Secondary | ICD-10-CM | POA: Diagnosis not present

## 2020-06-22 DIAGNOSIS — N390 Urinary tract infection, site not specified: Secondary | ICD-10-CM | POA: Diagnosis not present

## 2020-06-25 DIAGNOSIS — R300 Dysuria: Secondary | ICD-10-CM | POA: Diagnosis not present

## 2020-06-25 DIAGNOSIS — Z0000 Encounter for general adult medical examination without abnormal findings: Secondary | ICD-10-CM | POA: Diagnosis not present

## 2020-06-25 DIAGNOSIS — I10 Essential (primary) hypertension: Secondary | ICD-10-CM | POA: Diagnosis not present

## 2020-06-25 DIAGNOSIS — E782 Mixed hyperlipidemia: Secondary | ICD-10-CM | POA: Diagnosis not present

## 2020-06-25 DIAGNOSIS — I672 Cerebral atherosclerosis: Secondary | ICD-10-CM | POA: Diagnosis not present

## 2020-06-25 DIAGNOSIS — Z853 Personal history of malignant neoplasm of breast: Secondary | ICD-10-CM | POA: Diagnosis not present

## 2020-06-25 DIAGNOSIS — M4802 Spinal stenosis, cervical region: Secondary | ICD-10-CM | POA: Diagnosis not present

## 2020-06-25 DIAGNOSIS — K219 Gastro-esophageal reflux disease without esophagitis: Secondary | ICD-10-CM | POA: Diagnosis not present

## 2020-06-25 DIAGNOSIS — E538 Deficiency of other specified B group vitamins: Secondary | ICD-10-CM | POA: Diagnosis not present

## 2020-06-25 DIAGNOSIS — M5030 Other cervical disc degeneration, unspecified cervical region: Secondary | ICD-10-CM | POA: Diagnosis not present

## 2020-07-10 DIAGNOSIS — E538 Deficiency of other specified B group vitamins: Secondary | ICD-10-CM | POA: Diagnosis not present

## 2020-08-09 DIAGNOSIS — E538 Deficiency of other specified B group vitamins: Secondary | ICD-10-CM | POA: Diagnosis not present

## 2020-09-07 DIAGNOSIS — E538 Deficiency of other specified B group vitamins: Secondary | ICD-10-CM | POA: Diagnosis not present

## 2020-09-21 DIAGNOSIS — H814 Vertigo of central origin: Secondary | ICD-10-CM | POA: Diagnosis not present

## 2020-09-21 DIAGNOSIS — N390 Urinary tract infection, site not specified: Secondary | ICD-10-CM | POA: Diagnosis not present

## 2020-09-21 DIAGNOSIS — R319 Hematuria, unspecified: Secondary | ICD-10-CM | POA: Diagnosis not present

## 2020-09-21 DIAGNOSIS — R300 Dysuria: Secondary | ICD-10-CM | POA: Diagnosis not present

## 2020-09-25 DIAGNOSIS — S72143A Displaced intertrochanteric fracture of unspecified femur, initial encounter for closed fracture: Secondary | ICD-10-CM | POA: Diagnosis not present

## 2020-09-25 DIAGNOSIS — S72142D Displaced intertrochanteric fracture of left femur, subsequent encounter for closed fracture with routine healing: Secondary | ICD-10-CM | POA: Diagnosis not present

## 2020-09-25 DIAGNOSIS — Z8781 Personal history of (healed) traumatic fracture: Secondary | ICD-10-CM | POA: Diagnosis not present

## 2020-09-25 DIAGNOSIS — Z9889 Other specified postprocedural states: Secondary | ICD-10-CM | POA: Diagnosis not present

## 2020-10-08 DIAGNOSIS — E538 Deficiency of other specified B group vitamins: Secondary | ICD-10-CM | POA: Diagnosis not present

## 2020-11-08 DIAGNOSIS — E538 Deficiency of other specified B group vitamins: Secondary | ICD-10-CM | POA: Diagnosis not present

## 2020-12-10 DIAGNOSIS — E538 Deficiency of other specified B group vitamins: Secondary | ICD-10-CM | POA: Diagnosis not present

## 2021-01-09 DIAGNOSIS — E538 Deficiency of other specified B group vitamins: Secondary | ICD-10-CM | POA: Diagnosis not present

## 2021-01-09 DIAGNOSIS — I672 Cerebral atherosclerosis: Secondary | ICD-10-CM | POA: Diagnosis not present

## 2021-01-09 DIAGNOSIS — D692 Other nonthrombocytopenic purpura: Secondary | ICD-10-CM | POA: Diagnosis not present

## 2021-01-09 DIAGNOSIS — M792 Neuralgia and neuritis, unspecified: Secondary | ICD-10-CM | POA: Diagnosis not present

## 2021-01-09 DIAGNOSIS — R300 Dysuria: Secondary | ICD-10-CM | POA: Diagnosis not present

## 2021-01-09 DIAGNOSIS — K219 Gastro-esophageal reflux disease without esophagitis: Secondary | ICD-10-CM | POA: Diagnosis not present

## 2021-01-09 DIAGNOSIS — E782 Mixed hyperlipidemia: Secondary | ICD-10-CM | POA: Diagnosis not present

## 2021-01-09 DIAGNOSIS — I10 Essential (primary) hypertension: Secondary | ICD-10-CM | POA: Diagnosis not present

## 2021-01-09 DIAGNOSIS — Z853 Personal history of malignant neoplasm of breast: Secondary | ICD-10-CM | POA: Diagnosis not present

## 2021-01-15 DIAGNOSIS — R300 Dysuria: Secondary | ICD-10-CM | POA: Diagnosis not present

## 2021-09-19 ENCOUNTER — Other Ambulatory Visit: Payer: Self-pay | Admitting: Specialist

## 2021-09-19 DIAGNOSIS — N644 Mastodynia: Secondary | ICD-10-CM

## 2021-09-19 DIAGNOSIS — Z853 Personal history of malignant neoplasm of breast: Secondary | ICD-10-CM

## 2021-10-02 ENCOUNTER — Ambulatory Visit: Admission: RE | Admit: 2021-10-02 | Payer: Medicare Other | Source: Ambulatory Visit

## 2021-10-02 ENCOUNTER — Ambulatory Visit
Admission: RE | Admit: 2021-10-02 | Discharge: 2021-10-02 | Disposition: A | Discharge status: Home / Self Care | Payer: PPO | Source: Ambulatory Visit | Attending: Specialist | Admitting: Specialist

## 2021-10-02 ENCOUNTER — Other Ambulatory Visit: Payer: Medicare Other

## 2021-10-02 DIAGNOSIS — N644 Mastodynia: Secondary | ICD-10-CM

## 2021-10-02 DIAGNOSIS — Z853 Personal history of malignant neoplasm of breast: Secondary | ICD-10-CM

## 2022-05-20 NOTE — Progress Notes (Signed)
[facility]  [address] [place],  [place]  [address] [phone]  Clinic Day:  05/21/22  Referring physician: Gordan Payment., MD  ASSESSMENT & PLAN:  Assessment:  History of stage IIB (T2N1M0), hormone and HER2 positive breast cancer, diagnosed in 06/23/05 She was treated with adjuvant chemotherapy and adjuvant radiation. She was placed on Arimidex in November, 2008, but but stopped it on her own in March, 2010 because of increasing arthralgias. She remains without evidence of disease.   B-12 Deficiency She will continue monthly B-12 injections.   Osteoporosis She is not on any treatment for this and very hesitant to consider any. I reviewed some of her options and she will think over.  She will follow up with Dr.Grisso regarding the monitoring and treatment of this.  Fibrocystic changes of the breasts We discussed this diagnosis and measures she can take to control the symptoms.  Plan:  She has had intermittent pain in both breasts and I feel fibrocystic changes on examination. I advised her to decrease caffeine, and take vitamin-E 400 IU supplements daily. We discussed her bone density scan from 06-23-20 which revealed osteoporosis. I informed her of different medication options and their potential side effects. She will think about whether she wants to start any of the medications because she is concerned about the toxicities. I will see her back as needed since Dr. Shary Decamp is doing such a good job with surveillance and mammograms. She will discuss with Dr.Grisso regarding the follow up and treatment of her osteoporosis. The patient was provided an opportunity to ask questions and all were answered.  The patient agreed with the plan and demonstrated an understanding of the instructions. The patient was advised to call back if she needs or desires oncology follow up.  I provided 25 minutes  of face-to-face time during this this encounter and > 50% was spent  counseling as documented under my assessment and plan.   Dellia Beckwith, MD [facility] AT [facility] [address] [place] [address] Dept: [phone] Dept Fax: [phone]   CHIEF COMPLAINT:  CC: History of stage IIB (T2N1M0),  hormone and HER2 positive breast cancer  Current Treatment:  Surveillance   HISTORY OF PRESENT ILLNESS:  This patient is a 77 y.o woman with history of stage IIB (T2N1M0) breast cancer diagnosed in March 2007. She was treated with lumpectomy. Pathology revealed a 2.8cm, grade 3, invasive ductal carcinoma with 1 of 13 nodes positive for metastasis. Estrogen and progesterone receptors were positive and HER 2 Neu positive. She received adjuvant chemotherapy with Taxotere, Carboplatin, and Herceptin for 6 cycles. We recommended a year of Herceptin, but she stopped 6 weeks short of completing a full year because of toxicities. She received adjuvant radiation to the right breast. She was placed on Arimidex in November, 2008, but stopped it on her own in March, 2010 because of increasing arthralgias. She also has B-12 deficiency and is on B-12 injections monthly.   INTERVAL HISTORY:  Delena is here for repeat clinical assessment for her history of stage IIB (T2N1M0) breast cancer diagnosed in March 2007. She was last seen here on 07/15/2019. Patient states that she feels well and informed me that her husband has passed in Jun 24, 2018 and she is still coping with that.She had a hip fracture in February of 2022 and so was lost to follow up at the cancer center. Her last annual mammogram was 10/07/2021 which was clear.  She has had intermittent pain in both breasts.  I feel fibrocystic changes on examination. I advised her to decrease caffeine, and take vitamin-E 400 IU supplements daily. We discussed her bone density scan from 2022 which revealed osteoporosis. I informed her of different medication options and their potential  side effects. She will think about whether she wants to start any of the medication because she is concerned about the toxicities, and can discuss this further with Dr. Shary Decamp. She will be due for repeat DEXA this year.  I will see her back as needed since Dr. Shary Decamp is doing such a good job with surveillance and mammograms. She denies signs of infection such as sore throat, sinus drainage, cough, or urinary symptoms.  She denies fevers or recurrent chills. She denies pain. She denies nausea, vomiting, chest pain, dyspnea or cough. Her appetite is good and her weight  is 158lbs today .  REVIEW OF SYSTEMS:  Review of Systems  Constitutional: Negative.  Negative for appetite change, chills, diaphoresis, fatigue, fever and unexpected weight change.  HENT:  Negative.  Negative for hearing loss, lump/mass, mouth sores, nosebleeds, sore throat, tinnitus, trouble swallowing and voice change.   Eyes: Negative.  Negative for eye problems and icterus.  Respiratory: Negative.  Negative for chest tightness, cough, hemoptysis, shortness of breath and wheezing.   Cardiovascular: Negative.  Negative for chest pain, leg swelling and palpitations.  Gastrointestinal: Negative.  Negative for abdominal distention, abdominal pain, blood in stool, constipation, diarrhea, nausea, rectal pain and vomiting.  Endocrine: Negative.   Genitourinary: Negative.  Negative for bladder incontinence, difficulty urinating, dyspareunia, dysuria, frequency, hematuria, menstrual problem, nocturia, pelvic pain, vaginal bleeding and vaginal discharge.   Musculoskeletal:  Negative for arthralgias, back pain, flank pain, gait problem, myalgias, neck pain and neck stiffness.       Pain in both breasts  Skin: Negative.  Negative for itching, rash and wound.  Neurological:  Negative for dizziness, extremity weakness, gait problem, headaches, light-headedness, numbness, seizures and speech difficulty.  Hematological: Negative.  Negative for  adenopathy. Does not bruise/bleed easily.  Psychiatric/Behavioral: Negative.  Negative for confusion, decreased concentration, depression, sleep disturbance and suicidal ideas. The patient is not nervous/anxious.      VITALS:    PHYSICAL EXAM:  Physical Exam Vitals and nursing note reviewed.  Constitutional:      General: She is not in acute distress.    Appearance: Normal appearance. She is normal weight. She is not ill-appearing, toxic-appearing or diaphoretic.  HENT:     Head: Normocephalic and atraumatic.     Right Ear: Tympanic membrane, ear canal and external ear normal. There is no impacted cerumen.     Left Ear: Tympanic membrane, ear canal and external ear normal. There is no impacted cerumen.     Nose: Nose normal. No congestion or rhinorrhea.     Mouth/Throat:     Mouth: Mucous membranes are moist.     Pharynx: Oropharynx is clear. No oropharyngeal exudate or posterior oropharyngeal erythema.  Eyes:     General: No scleral icterus.       Right eye: No discharge.        Left eye: No discharge.     Extraocular Movements: Extraocular movements intact.     Conjunctiva/sclera: Conjunctivae normal.     Pupils: Pupils are equal, round, and reactive to light.  Neck:     Vascular: No carotid bruit.  Cardiovascular:     Rate and Rhythm: Normal rate and regular rhythm.  Pulses: Normal pulses.     Heart sounds: Normal heart sounds. No murmur heard.    No friction rub. No gallop.  Pulmonary:     Effort: Pulmonary effort is normal. No respiratory distress.     Breath sounds: Normal breath sounds. No stridor. No wheezing, rhonchi or rales.  Chest:     Chest wall: No tenderness.     Comments: Faded scar in the lower out quadrant of the right breast and well healed scar in the right axilla.  Mild to moderate fibrocystic changes in both breasts R>L Abdominal:     General: Bowel sounds are normal. There is no distension.     Palpations: Abdomen is soft. There is no  hepatomegaly, splenomegaly or mass.     Tenderness: There is no abdominal tenderness. There is no right CVA tenderness, left CVA tenderness, guarding or rebound.     Hernia: No hernia is present.  Musculoskeletal:        General: No swelling, tenderness, deformity or signs of injury. Normal range of motion.     Cervical back: Normal range of motion and neck supple. No rigidity or tenderness.     Right lower leg: No edema.     Left lower leg: No edema.  Lymphadenopathy:     Cervical: No cervical adenopathy.     Right cervical: No superficial, deep or posterior cervical adenopathy.    Left cervical: No superficial, deep or posterior cervical adenopathy.     Upper Body:     Right upper body: No supraclavicular, axillary or pectoral adenopathy.     Left upper body: No supraclavicular, axillary or pectoral adenopathy.  Skin:    General: Skin is warm and dry.     Coloration: Skin is not jaundiced or pale.     Findings: No bruising, erythema, lesion or rash.  Neurological:     General: No focal deficit present.     Mental Status: She is alert and oriented to person, place, and time. Mental status is at baseline.     Cranial Nerves: No cranial nerve deficit.     Sensory: No sensory deficit.     Motor: No weakness.     Coordination: Coordination normal.     Gait: Gait normal.     Deep Tendon Reflexes: Reflexes normal.  Psychiatric:        Mood and Affect: Mood normal.        Behavior: Behavior normal.        Thought Content: Thought content normal.        Judgment: Judgment normal.    LABS:   Component Ref Range & Units 07/31/2022  WBC 4.4 - 11.0 x 10*3/uL 4.7  RBC 4.10 - 5.10 x 10*6/uL 5.13 High   Hemoglobin 12.3 - 15.3 G/DL 16.1  Hematocrit 09.6 - 44.6 % 43.9  MCV 80.0 - 96.0 FL 85.5  MCH 27.5 - 33.2 PG 28.4  MCHC 33.0 - 37.0 G/DL 04.5  RDW 40.9 - 81.1 % 13.5  Platelets 150 - 450 X 10*3/uL 202  MPV 6.8 - 10.2 FL 9.1   Component Ref Range & Units 07/31/2022   Sodium 135 - 146 MMOL/L 141  Potassium 3.5 - 5.3 MMOL/L 4.2  Chloride 98 - 110 MMOL/L 105  CO2 23 - 30 MMOL/L 30  BUN 8 - 24 MG/DL 15  Glucose 70 - 99 MG/DL 914 High   Creatinine 7.82 - 1.50 MG/DL 9.56  Calcium 8.5 - 21.3 MG/DL 9.5  Total Protein 6.0 -  8.3 G/DL 6.9  Albumin 3.5 - 5.0 G/DL 4.4  Total Bilirubin 0.1 - 1.2 MG/DL 0.7  Alkaline Phosphatase 25 - 125 IU/L or U/L 80  AST (SGOT) 5 - 40 IU/L or U/L 17  ALT (SGPT) 5 - 50 IU/L or U/L 12   Component Ref Range & Units 07/31/2022  LDL Direct <130 mg/dL 161  Total Cholesterol 25 - 199 MG/DL 096 High   Triglycerides 10 - 150 MG/DL 81  HDL Cholesterol 35 - 135 MG/DL 69  Total Chol / HDL Cholesterol <4.5 3.1  Non-HDL Cholesterol MG/DL 045   Component Ref Range & Units 07/31/2022  TSH 0.45 - 5.00 UIU/ML 1.53   Component Ref Range & Units 07/31/2022  FREE T4 0.6 - 1.3 NG/DL 0.9   STUDIES:  EXAM: 40/98/1191 DIGITAL DIAGNOSTIC BILATERAL MAMMOGRAM WITH TOMOSYNTHESIS IMPRESSION: No evidence of malignancy within either breast.    I,Jasmine M Lassiter,acting as a scribe for Dellia Beckwith, MD.,have documented all relevant documentation on the behalf of Dellia Beckwith, MD,as directed by  Dellia Beckwith, MD while in the presence of Dellia Beckwith, MD.

## 2022-05-21 ENCOUNTER — Encounter: Payer: Self-pay | Admitting: Oncology

## 2022-05-21 ENCOUNTER — Inpatient Hospital Stay: Payer: PPO | Attending: Oncology | Admitting: Oncology

## 2022-05-21 VITALS — BP 134/84 | HR 89 | Temp 98.7°F | Resp 18 | Ht 65.0 in | Wt 158.5 lb

## 2022-05-21 DIAGNOSIS — E538 Deficiency of other specified B group vitamins: Secondary | ICD-10-CM

## 2022-05-21 DIAGNOSIS — Z853 Personal history of malignant neoplasm of breast: Secondary | ICD-10-CM

## 2022-05-21 DIAGNOSIS — M810 Age-related osteoporosis without current pathological fracture: Secondary | ICD-10-CM

## 2022-06-10 DIAGNOSIS — E538 Deficiency of other specified B group vitamins: Secondary | ICD-10-CM | POA: Insufficient documentation

## 2022-06-10 DIAGNOSIS — M810 Age-related osteoporosis without current pathological fracture: Secondary | ICD-10-CM | POA: Insufficient documentation

## 2023-01-11 DEATH — deceased

## 2023-02-18 ENCOUNTER — Ambulatory Visit (HOSPITAL_BASED_OUTPATIENT_CLINIC_OR_DEPARTMENT_OTHER)
Admission: EM | Admit: 2023-02-18 | Discharge: 2023-02-18 | Disposition: A | Discharge status: Home / Self Care | Payer: PPO

## 2023-02-18 ENCOUNTER — Other Ambulatory Visit (HOSPITAL_BASED_OUTPATIENT_CLINIC_OR_DEPARTMENT_OTHER): Payer: Self-pay

## 2023-02-18 ENCOUNTER — Encounter (HOSPITAL_BASED_OUTPATIENT_CLINIC_OR_DEPARTMENT_OTHER): Payer: Self-pay

## 2023-02-18 DIAGNOSIS — R067 Sneezing: Secondary | ICD-10-CM | POA: Diagnosis not present

## 2023-02-18 DIAGNOSIS — H04202 Unspecified epiphora, left lacrimal gland: Secondary | ICD-10-CM

## 2023-02-18 DIAGNOSIS — J069 Acute upper respiratory infection, unspecified: Secondary | ICD-10-CM

## 2023-02-18 DIAGNOSIS — J3489 Other specified disorders of nose and nasal sinuses: Secondary | ICD-10-CM

## 2023-02-18 MED ORDER — TRIAMCINOLONE ACETONIDE 40 MG/ML IJ SUSP
40.0000 mg | Freq: Once | INTRAMUSCULAR | Status: AC
  Administered 2023-02-18: 40 mg via INTRAMUSCULAR

## 2023-02-18 MED ORDER — FLUTICASONE PROPIONATE 50 MCG/ACT NA SUSP
1.0000 | Freq: Every day | NASAL | 0 refills | Status: DC | PRN
  Filled 2023-02-18: qty 16, 60d supply, fill #0

## 2023-02-18 NOTE — ED Provider Notes (Signed)
 [facility]    CSN: [id number] Arrival date & time: 02/18/23  1215      History   Chief Complaint Chief Complaint  Patient presents with   Facial Pain    HPI Marissa Black is a 78 y.o. female.   Here with complaint of nasal congestion, sinus pressure on the left side, watery eye on the left side, for 24 hours.  Patient has had sinusitis several times in the past and reports that this is how it always starts.  She has not had any fever, nausea, vomiting, diarrhea, constipation.  She feels that she needs an antibiotic to stop the sinusitis before it gets bad.     Past Medical History:  Diagnosis Date   Family history of breast cancer    Family history of kidney cancer    Family history of stomach cancer    Personal history of breast cancer    Personal history of chemotherapy    Personal history of radiation therapy     Patient Active Problem List   Diagnosis Date Noted   Osteoporosis 06/10/2022   B12 deficiency 06/10/2022   Genetic testing 05/05/2016   Personal history of breast cancer    Family history of breast cancer    Family history of kidney cancer    Family history of stomach cancer     Past Surgical History:  Procedure Laterality Date   BREAST LUMPECTOMY Right 2007   x2    OB History   No obstetric history on file.      Home Medications    Prior to Admission medications   Medication Sig Start Date End Date Taking? Authorizing Provider  fluticasone  (FLONASE ) 50 MCG/ACT nasal spray Place 1 spray into both nostrils daily as needed for rhinitis. 02/18/23 03/20/23 Yes Ival Domino, FNP  pantoprazole (PROTONIX) 20 MG tablet Take 20 mg by mouth daily.   Yes [provider]  amLODipine (NORVASC) 5 MG tablet Take by mouth. 10/21/19   [provider]  aspirin EC 81 MG tablet Take by mouth. 06/08/18   [provider]  cyanocobalamin  (VITAMIN B12) 1000 MCG/ML injection Inject into the muscle. 06/26/20 06/11/04   [provider]  omeprazole (PRILOSEC) 20 MG capsule Take by mouth. 10/21/19   [provider]    Family History Family History  Problem Relation Age of Onset   Kidney cancer Mother        dx in her 63s   Congestive Heart Failure Father    Lung cancer Brother        smoker   Cirrhosis Maternal Aunt    Cirrhosis Maternal Uncle    Stomach cancer Maternal Grandmother        dx in her 41s   Cirrhosis Maternal Uncle    Breast cancer Cousin 25       maternal first cousin   Breast cancer Cousin        dx in her 93s; maternal first cousin   Breast cancer Cousin        dx in her 70s, maternal first cousin    Social History Social History   Tobacco Use   Smoking status: Never   Smokeless tobacco: Never  Substance Use Topics   Alcohol use: No   Drug use: No     Allergies   Codeine   Review of Systems Review of Systems  Constitutional:  Negative for chills and fever.  HENT:  Positive for congestion, ear pain, rhinorrhea, sinus pressure  and sinus pain. Negative for sore throat.   Eyes:  Negative for pain and visual disturbance.  Respiratory:  Negative for cough and shortness of breath.   Cardiovascular:  Negative for chest pain and palpitations.  Gastrointestinal:  Negative for abdominal pain, constipation, diarrhea, nausea and vomiting.  Genitourinary:  Negative for dysuria and hematuria.  Musculoskeletal:  Negative for arthralgias and back pain.  Skin:  Negative for color change and rash.  Neurological:  Negative for seizures and syncope.  Hematological:  Positive for adenopathy.  All other systems reviewed and are negative.    Physical Exam Triage Vital Signs ED Triage Vitals  Encounter Vitals Group     BP 02/18/23 1309 (!) 146/76     Systolic BP Percentile --      Diastolic BP Percentile --      Pulse Rate 02/18/23 1309 90     Resp 02/18/23 1309 20     Temp 02/18/23 1309 98 F (36.7 C)     Temp Source 02/18/23 1309 Oral     SpO2 02/18/23  1309 98 %     Weight 02/18/23 1311 159 lb (72.1 kg)     Height --      Head Circumference --      Peak Flow --      Pain Score 02/18/23 1310 5     Pain Loc --      Pain Education --      Exclude from Growth Chart --    No data found.  Updated Vital Signs BP (!) 146/76 (BP Location: Left Arm)   Pulse 90   Temp 98 F (36.7 C) (Oral)   Resp 20   Wt 159 lb (72.1 kg)   SpO2 98%   BMI 26.46 kg/m   Visual Acuity Right Eye Distance:   Left Eye Distance:   Bilateral Distance:    Right Eye Near:   Left Eye Near:    Bilateral Near:     Physical Exam Vitals and nursing note reviewed.  Constitutional:      General: She is not in acute distress.    Appearance: She is well-developed. She is not ill-appearing or toxic-appearing.  HENT:     Head: Normocephalic and atraumatic.     Right Ear: Hearing, tympanic membrane, ear canal and external ear normal.     Left Ear: Hearing, tympanic membrane, ear canal and external ear normal.     Nose: Congestion and rhinorrhea present. Rhinorrhea is clear.     Right Turbinates: Not swollen.     Left Turbinates: Not swollen.     Right Sinus: No maxillary sinus tenderness or frontal sinus tenderness.     Left Sinus: Maxillary sinus tenderness and frontal sinus tenderness present.     Mouth/Throat:     Lips: Pink.     Mouth: Mucous membranes are moist.     Pharynx: Uvula midline. No oropharyngeal exudate or posterior oropharyngeal erythema.     Tonsils: No tonsillar exudate.  Eyes:     Conjunctiva/sclera: Conjunctivae normal.     Pupils: Pupils are equal, round, and reactive to light.     Comments: Left eye is watering some.  Otherwise eyes without swelling of the lids, erythema, discharge nor exudate.  Cardiovascular:     Rate and Rhythm: Normal rate and regular rhythm.     Heart sounds: Normal heart sounds, S1 normal and S2 normal. No murmur heard. Pulmonary:     Effort: Pulmonary effort is normal. No respiratory distress.  Breath  sounds: Normal breath sounds.  Abdominal:     Palpations: Abdomen is soft.     Tenderness: There is no abdominal tenderness.  Musculoskeletal:        General: No swelling.     Cervical back: Neck supple.  Lymphadenopathy:     Head:     Right side of head: No submental, submandibular, tonsillar, preauricular or posterior auricular adenopathy.     Left side of head: No submental, submandibular, tonsillar, preauricular or posterior auricular adenopathy.     Cervical:     Right cervical: No superficial cervical adenopathy.    Left cervical: No superficial cervical adenopathy.  Skin:    General: Skin is warm and dry.     Capillary Refill: Capillary refill takes less than 2 seconds.     Findings: No rash.  Neurological:     Mental Status: She is alert and oriented to person, place, and time.  Psychiatric:        Mood and Affect: Mood normal.      UC Treatments / Results  Labs (all labs ordered are listed, but only abnormal results are displayed) Labs Reviewed - No data to display  EKG   Radiology No results found.  Procedures Procedures (including critical care time)  Medications Ordered in UC Medications  triamcinolone  acetonide (KENALOG -40) injection 40 mg (has no administration in time range)    Initial Impression / Assessment and Plan / UC Course  I have reviewed the triage vital signs and the nursing notes.  Pertinent labs & imaging results that were available during my care of the patient were reviewed by me and considered in my medical decision making (see chart for details).  Exam is consistent with viral upper respiratory infection and secondary sinus pressure with some watering of her left eye.  Will treat with Kenalog , 40 mg, IM today.  Provided fluticasone  nasal spray, 1 spray into each nostril, daily for nasal congestion.  Declined any drops or ointments for her left eye.  Encouraged good fluid intake.  Reviewed the risk benefits and alternatives of using  Kenalog  and fluticasone .  Follow-up here if symptoms do not improve, worsen, or new symptoms occur.  Discussion about why antibiotics are not indicated and not needed at this time. Final Clinical Impressions(s) / UC Diagnoses   Final diagnoses:  Sinus pressure  Viral URI  Sneezing with left watery eye     Discharge Instructions      Exam is consistent with some sinus pressure and a viral upper respiratory infection.  Given her sinus pressure will treat with Kenalog , 40 mg, injection given in the office today.  Offered eyedrops or ointment but she declined.  Will provide fluticasone  nasal spray, 1 spray into each nostril, twice daily for nasal congestion.  Follow-up here if symptoms do not improve, worsen, or new symptoms occur.     ED Prescriptions     Medication Sig Dispense Auth. Provider   fluticasone  (FLONASE ) 50 MCG/ACT nasal spray Place 1 spray into both nostrils daily as needed for rhinitis. 17 mL Ival Domino, FNP      PDMP not reviewed this encounter.   Ival Domino, FNP 02/18/23 [phone]

## 2023-02-18 NOTE — ED Triage Notes (Signed)
 Initially presents for eval of tenderness around left eye. Sneezing, watery eyes. Slight headache and left ear pain. States thinks she has a sinus infection. Congestion started yesterday.

## 2023-02-18 NOTE — Discharge Instructions (Addendum)
 Exam is consistent with some sinus pressure and a viral upper respiratory infection.  Given her sinus pressure will treat with Kenalog , 40 mg, injection given in the office today.  Offered eyedrops or ointment but she declined.  Will provide fluticasone  nasal spray, 1 spray into each nostril, twice daily for nasal congestion.  Follow-up here if symptoms do not improve, worsen, or new symptoms occur.

## 2023-03-02 ENCOUNTER — Other Ambulatory Visit (HOSPITAL_BASED_OUTPATIENT_CLINIC_OR_DEPARTMENT_OTHER): Payer: Self-pay

## 2023-03-19 ENCOUNTER — Encounter: Payer: Self-pay | Admitting: Gastroenterology

## 2023-05-22 ENCOUNTER — Other Ambulatory Visit (INDEPENDENT_AMBULATORY_CARE_PROVIDER_SITE_OTHER)

## 2023-05-22 ENCOUNTER — Ambulatory Visit: Payer: PPO | Admitting: Gastroenterology

## 2023-05-22 ENCOUNTER — Encounter: Payer: Self-pay | Admitting: Gastroenterology

## 2023-05-22 VITALS — BP 122/82 | HR 72 | Ht 64.0 in | Wt 155.0 lb

## 2023-05-22 DIAGNOSIS — Z853 Personal history of malignant neoplasm of breast: Secondary | ICD-10-CM

## 2023-05-22 DIAGNOSIS — D509 Iron deficiency anemia, unspecified: Secondary | ICD-10-CM

## 2023-05-22 DIAGNOSIS — K5900 Constipation, unspecified: Secondary | ICD-10-CM | POA: Diagnosis not present

## 2023-05-22 DIAGNOSIS — K219 Gastro-esophageal reflux disease without esophagitis: Secondary | ICD-10-CM

## 2023-05-22 DIAGNOSIS — R1032 Left lower quadrant pain: Secondary | ICD-10-CM

## 2023-05-22 LAB — CBC WITH DIFFERENTIAL/PLATELET
Basophils Absolute: 0.1 10*3/uL (ref 0.0–0.1)
Basophils Relative: 0.8 % (ref 0.0–3.0)
Eosinophils Absolute: 0 10*3/uL (ref 0.0–0.7)
Eosinophils Relative: 0.5 % (ref 0.0–5.0)
HCT: 36.3 % (ref 36.0–46.0)
Hemoglobin: 11.9 g/dL — ABNORMAL LOW (ref 12.0–15.0)
Lymphocytes Relative: 19.2 % (ref 12.0–46.0)
Lymphs Abs: 1.4 10*3/uL (ref 0.7–4.0)
MCHC: 32.7 g/dL (ref 30.0–36.0)
MCV: 81.3 fl (ref 78.0–100.0)
Monocytes Absolute: 0.4 10*3/uL (ref 0.1–1.0)
Monocytes Relative: 5.7 % (ref 3.0–12.0)
Neutro Abs: 5.4 10*3/uL (ref 1.4–7.7)
Neutrophils Relative %: 73.8 % (ref 43.0–77.0)
Platelets: 282 10*3/uL (ref 150.0–400.0)
RBC: 4.46 Mil/uL (ref 3.87–5.11)
RDW: 18.2 % — ABNORMAL HIGH (ref 11.5–15.5)
WBC: 7.3 10*3/uL (ref 4.0–10.5)

## 2023-05-22 LAB — COMPREHENSIVE METABOLIC PANEL WITH GFR
ALT: 11 U/L (ref 0–35)
AST: 17 U/L (ref 0–37)
Albumin: 4.2 g/dL (ref 3.5–5.2)
Alkaline Phosphatase: 69 U/L (ref 39–117)
BUN: 16 mg/dL (ref 6–23)
CO2: 28 meq/L (ref 19–32)
Calcium: 9.3 mg/dL (ref 8.4–10.5)
Chloride: 106 meq/L (ref 96–112)
Creatinine, Ser: 0.85 mg/dL (ref 0.40–1.20)
GFR: 66.16 mL/min (ref >60.00)
Glucose, Bld: 100 mg/dL — ABNORMAL HIGH (ref 70–99)
Potassium: 4.4 meq/L (ref 3.5–5.1)
Sodium: 141 meq/L (ref 135–145)
Total Bilirubin: 0.4 mg/dL (ref 0.2–1.2)
Total Protein: 6.8 g/dL (ref 6.0–8.3)

## 2023-05-22 LAB — IBC + FERRITIN
Ferritin: 6.5 ng/mL — ABNORMAL LOW (ref 10.0–291.0)
Iron: 32 ug/dL — ABNORMAL LOW (ref 42–145)
Saturation Ratios: 7.5 % — ABNORMAL LOW (ref 20.0–50.0)
TIBC: 425.6 ug/dL (ref 250.0–450.0)
Transferrin: 304 mg/dL (ref 212.0–360.0)

## 2023-05-22 NOTE — Progress Notes (Signed)
 Chief Complaint:  new onset IDA  Referring Provider:  Gordan Payment., MD      ASSESSMENT AND PLAN;   #1. IDA with heme neg stools (Hb 25 July 2022 to 9.4 02/2023), neg SPEP  #2. GERD  #3. Constipation with LLQ pain  #4. H/O breast Ca   Plan: -EGD/colon with miralax -protonix 20mg  po every day to continue -CT AP with contrast -CBC, CMP, iron studies -FU appt with Dr Gilman Buttner. Due for FU.    I discussed EGD/Colonoscopy- the indications, risks, alternatives and potential complications including, but not limited to bleeding, infection, reaction to meds, damage to internal organs, cardiac and/or pulmonary problems, and perforation requiring surgery. The possibility that significant findings could be missed was explained. All ? were answered. Pt consents to proceed. HPI:    Marissa Black is a 78 y.o. female  With history of HTN, osteoporosis, GERD, B12 deficiency, breast CA, s/p cholecystectomy, hysterectomy History of Present Illness Marissa Black is a 78 year old female with iron deficiency anemia who presents with low hemoglobin levels. She was referred by Alona Bene for evaluation of low hemoglobin levels.  Low hemoglobin levels were noted to be 9.4 in Jan 2025 (Was 14.16 July 2022), identified during a hospital visit on December 30th. Her mean corpuscular volume (MCV) was recorded at 71. Stool tests were negative for blood. She has been taking iron tablets but stopped in the last week or two due to exacerbation of constipation.  She experiences heartburn with varying severity. Initially on omeprazole, she switched to pantoprazole in January, which has provided some improvement, though heartburn persists. No dysphagia, nausea, or vomiting, but bending over can cause reflux. No abdominal pain.  She experiences constipation, attributed to iron supplements, with bowel movements typically once a day. No diarrhea.  Rare LLQ discomfort.  Her past medical  history includes breast cancer, diagnosed in 2007 as stage 2B, and osteoporosis. She takes B12 supplements and a baby aspirin regularly. No recent weight loss.  No sodas, chocolates, chewing gums, artificial sweeteners and candy. No NSAIDs  Past GI workup: Colonoscopy by Dr. Charm Barges 2008-negative  Results LABS Hb: 9.4 (02/09/2023) MCV: 71 (02/09/2023) Fecal occult blood test: Negative (02/09/2023)    Past Medical History:  Diagnosis Date   Age-related osteoporosis without current pathological fracture    Anemia    Atherosclerosis of vertebral artery    Cervical spinal stenosis    Degenerative cervical disc    Essential hypertension    Family history of breast cancer    Family history of kidney cancer    Family history of stomach cancer    GERD (gastroesophageal reflux disease)    History of cardiac arrhythmia    History of hypothyroidism    Kidney cyst, acquired    Personal history of breast cancer    Personal history of chemotherapy    Personal history of radiation therapy    Recurrent UTI    RLS (restless legs syndrome)    S/p left hip fracture    Tinnitus    Vitamin B12 deficiency     Past Surgical History:  Procedure Laterality Date   BREAST LUMPECTOMY Right 2007   x2   CHOLECYSTECTOMY     PARTIAL HYSTERECTOMY      Family History  Problem Relation Age of Onset   Kidney cancer Mother        dx in her 11s   Congestive Heart Failure Father    Lung cancer Brother  smoker   Stomach cancer Maternal Grandmother        dx in her 48s   Cirrhosis Maternal Aunt    Cirrhosis Maternal Uncle    Cirrhosis Maternal Uncle    Breast cancer Cousin 25       maternal first cousin   Breast cancer Cousin        dx in her 67s; maternal first cousin   Breast cancer Cousin        dx in her 20s, maternal first cousin   Liver disease Neg Hx    Esophageal cancer Neg Hx    Colon cancer Neg Hx     Social History   Tobacco Use   Smoking status: Never   Smokeless  tobacco: Never  Vaping Use   Vaping status: Never Used  Substance Use Topics   Alcohol use: No   Drug use: No    Current Outpatient Medications  Medication Sig Dispense Refill   amLODipine (NORVASC) 5 MG tablet Take by mouth.     aspirin EC 81 MG tablet Take by mouth.     cyanocobalamin (VITAMIN B12) 1000 MCG/ML injection Inject into the muscle.     pantoprazole (PROTONIX) 20 MG tablet Take 20 mg by mouth daily.     fluticasone (FLONASE) 50 MCG/ACT nasal spray Place 1 spray into both nostrils daily as needed for rhinitis. 16 g 0   No current facility-administered medications for this visit.    Allergies  Allergen Reactions   Codeine Itching    Review of Systems:  Constitutional: Denies fever, chills, diaphoresis, appetite change and fhas atigue.  HEENT: Had subconjunctival hemorrhage-resolved. Respiratory: Denies SOB, DOE, cough, chest tightness,  and wheezing.   Cardiovascular: Denies chest pain, palpitations and leg swelling.  Genitourinary: Denies dysuria, urgency, frequency, hematuria, flank pain and difficulty urinating.  Musculoskeletal: Has osteoarthritis.  Skin: No rash.  Neurological: Denies dizziness, seizures, syncope, weakness, light-headedness, numbness and headaches.  Hematological: Denies adenopathy. Easy bruising, personal or family bleeding history  Psychiatric/Behavioral: has anxiety or depression     Physical Exam:    BP 122/82   Pulse 72   Ht 5\' 4"  (1.626 m)   Wt 155 lb (70.3 kg)   BMI 26.61 kg/m  Wt Readings from Last 3 Encounters:  05/22/23 155 lb (70.3 kg)  02/18/23 159 lb (72.1 kg)  05/21/22 158 lb 8 oz (71.9 kg)   Constitutional:  Well-developed, in no acute distress. Psychiatric: Normal mood and affect. Behavior is normal. HEENT: Pupils normal.  Conjunctivae are normal. No scleral icterus. Neck supple.  Cardiovascular: Normal rate, regular rhythm. No edema Pulmonary/chest: Effort normal and breath sounds normal. No wheezing, rales or  rhonchi. Abdominal: Soft, nondistended. Nontender. Bowel sounds active throughout. There are no masses palpable. No hepatomegaly. Rectal: Deferred Neurological: Alert and oriented to person place and time. Skin: Skin is warm and dry. No rashes noted.  Data Reviewed: I have personally reviewed following labs and imaging studies  See above    Contains abnormal data CBC with Differential on 02/09/2023 Order: [id number] Component Ref Range & Units 2 mo ago  WBC 4.40 - 11.00 10*3/uL 2.7 Low   RBC 4.10 - 5.10 10*6/uL 4.25  Hemoglobin 12.3 - 15.3 g/dL 9.4 Low   Hematocrit 40.9 - 44.6 % 30.3 Low   Mean Corpuscular Volume (MCV) 80.0 - 96.0 fL 71.2 Low   Mean Corpuscular Hemoglobin (MCH) 27.5 - 33.2 pg 22.2 Low   Mean Corpuscular Hemoglobin Conc (MCHC) 33.0 - 37.0 g/dL 31.2  Low   Red Cell Distribution Width (RDW) 12.3 - 17.0 % 20.1 High   Platelet Count (PLT) 150 - 450 10*3/uL 277    Normal CMP with BUN 18 creatinine 0.83 on 02/09/2023    Edman Circle, MD 05/22/2023, 10:41 AM  Cc: Gordan Payment., MD

## 2023-05-22 NOTE — Patient Instructions (Addendum)
 _______________________________________________________  If your blood pressure at your visit was 140/90 or greater, please contact your primary care physician to follow up on this.  _______________________________________________________  If you are age 77 or older, your body mass index should be between 23-30. Your Body mass index is 26.61 kg/m. If this is out of the aforementioned range listed, please consider follow up with your Primary Care Provider.  If you are age 89 or younger, your body mass index should be between 19-25. Your Body mass index is 26.61 kg/m. If this is out of the aformentioned range listed, please consider follow up with your Primary Care Provider.   ________________________________________________________  The [facility] providers would like to encourage you to use [employer] to communicate with providers for non-urgent requests or questions.  Due to long hold times on the telephone, sending your provider a message by [facility] may be a faster and more efficient way to get a response.  Please allow 48 business hours for a response.  Please remember that this is for non-urgent requests.  _______________________________________________________  Dr Gilman Buttner office please call them at [phone] to see about an appointment   Your provider has requested that you go to the basement level for lab work before leaving today. Press "B" on the elevator. The lab is located at the first door on the left as you exit the elevator.  You have been scheduled for a CT scan of the abdomen and pelvis at [facility] (Address [address] [place], [place], [place] [address].   You are scheduled on 06-04-23 at 12pm. You should arrive by 10am your appointment time for registration. Left side when you enter the urgent care desk 3 Please follow the written instructions below on the day of your exam:  WARNING: IF YOU ARE ALLERGIC TO IODINE/X-RAY DYE, PLEASE NOTIFY RADIOLOGY IMMEDIATELY AT  [phone]! YOU WILL BE GIVEN A 13 HOUR PREMEDICATION PREP.  1) Do not eat or drink anything after 8am(4 hours prior to your test) 2) You will be given 2 bottles of oral contrast to drink on site.    Drink 1 bottle of contrast @ 10am (2 hours prior to your exam)  Drink 1 bottle of contrast @ 11am (1 hour prior to your exam)  You may take any medications as prescribed with a small amount of water, if necessary. If you take any of the following medications: METFORMIN, GLUCOPHAGE, GLUCOVANCE, AVANDAMET, RIOMET, FORTAMET, ACTOPLUS MET, JANUMET, GLUMETZA or METAGLIP, you MAY be asked to HOLD this medication 48 hours AFTER the exam.  The purpose of you drinking the oral contrast is to aid in the visualization of your intestinal tract. The contrast solution may cause some diarrhea. Depending on your individual set of symptoms, you may also receive an intravenous injection of x-ray contrast/dye. Plan on being at [facility] for 30 minutes or longer, depending on the type of exam you are having performed.  This test typically takes 30-45 minutes to complete.  If you have any questions regarding your exam or if you need to reschedule, you may call the CT department at [phone] between the hours of 8:00 am and 5:00 pm, Monday-Friday. Contrast is only on Thursday. Without contrast is everyday.    You have been scheduled for an endoscopy and colonoscopy. Please follow the written instructions given to you at your visit today.  If you use inhalers (even only as needed), please bring them with you on the day of your procedure.  DO NOT TAKE  7 DAYS PRIOR TO TEST- Trulicity (dulaglutide) Ozempic, Wegovy (semaglutide) Mounjaro (tirzepatide) Bydureon Bcise (exanatide extended release)  DO NOT TAKE 1 DAY PRIOR TO YOUR TEST Rybelsus (semaglutide) Adlyxin (lixisenatide) Victoza (liraglutide) Byetta  (exanatide) ___________________________________________________________________________   Thank you,  Dr. Lynann Bologna

## 2023-05-25 ENCOUNTER — Other Ambulatory Visit: Payer: Self-pay | Admitting: Hematology and Oncology

## 2023-05-25 DIAGNOSIS — D509 Iron deficiency anemia, unspecified: Secondary | ICD-10-CM

## 2023-05-25 NOTE — Progress Notes (Signed)
 [facility] [address] [place],  [place]  [address] [phone]  Clinic Day:  05/26/2023   Referring physician: Abbe Hoard., MD  Patient Care Team: Patient Care Team: Abbe Hoard., MD as PCP - General (Internal Medicine)   REASON FOR CONSULTATION:  Iron deficiency anemia  HISTORY OF PRESENT ILLNESS:  Marissa Black is a 78 y.o. female with newly diagnosed iron deficiency who is referred in consultation by Dr. Lajuan Pila for assessment and management.  He saw her on April 11 for consideration of GI evaluation.  CBC revealed 11.9 with an MCV of 81, WBC 7.3 with a normal differential and platelets 282,000.  She had worsening iron was 32, TIBC 304 and iron saturation 6.5% and ferritin 7.5 despite oral iron supplement.   She is scheduled for CT abdomen and pelvis April 24, with plans for EGD and colonoscopy.   She saw Dr. Eluterio Hamburg on December 30 for routine follow-up.  CBC without differential revealed WBCs 5.4, hemoglobin 8.5 with an MCV 71, and platelets 362,000.  CMP was unremarkable. She was called to come back for additional testing, but to the emergency room if she experiences any bleeding.  She did end up going to to [facility] ER on January 2 with a conjunctival hemorrhage.  CBC revealed WBC 6.9, hemoglobin 9.3, MCV 78, platelets 389,000.  She had evidence of iron deficiency with serum iron 22, TIBC 466, saturation 4.7% and ferritin 5.9.  Stool Hemoccult was negative. She was started on ferrous sulfate daily.  She was seen again by Darrelyn Ely at Dr. Ruby Corporal office on January 27.  CBC revealed hemoglobin 9.4 with an MCV of 71, WBCs 2.7, with 63% neutrophils, 38% lymphocytes, 8% monocytes, and 1% basophils, and platelets 277,000.  Iron studies were equivocal with iron 201, TIBC 442 and iron saturation 45%.  Ferritin was 14. Oral iron was continued.  Of note, also has a history of B12 deficiency and continues B12 monthly at Dr. Ruby Corporal  office.  She also has a remote history of stage IIB hormone and HER2 receptor positive breast cancer treated with lumpectomy, adjuvant chemotherapy/HER2 targeted therapy and adjuvant radiation. She was placed on anastrozole in November 2008, but but stopped it on her own in March 2010 because of increasing arthralgias.  Due to her personal and family history of malignancy, she underwent genetic testing for hereditary cancer syndromes with the [employer] multi cancer panel test in March 2018.  This did not reveal any clinically significant mutations or variants of uncertain significance.  Her last mammogram was in August 2023 and did not reveal any evidence of malignancy.  Dr. Almer Jacobson discharged her from clinic last year, as she follows closely with Dr. Eluterio Hamburg and he had been ordering her mammograms.   The patient states she has difficulty tolerating oral iron due to constipation.  She denies any overt form of blood loss.  She reports continued pica to ice. Her other symptoms of fatigue and shortness of breath with exertion have basically resolved. She reports lower back stiffness, but denies pain.  She is on aspirin for atherosclerosis. She states Dr. Venice Gillis did not recommend holding the aspirin at this time.  Past medical history: As above, as well as hypertension, GERD, osteoporosis, DDD/DJD, restless leg syndrome.  History of kidney stones.  Status post cholecystectomy, right breast lumpectomy and hysterectomy, ovaries are intact.  Social history:  She denies any history of tobacco, alcohol, or other substance abuse. She is widowed with  2 adult children. A grandson and his wife live next door. She is retired from [employer].  Family history: Her mother had kidney cancer. A brother had lung cancer. Her maternal grandmother had stomach cancer.   Two maternal first cousins had breast cancer, one at age 68.   REVIEW OF SYSTEMS:  Review of Systems  Constitutional:  Positive for fatigue (back to baseline).  Negative for appetite change, chills, diaphoresis, fever and unexpected weight change.  HENT:   Negative for lump/mass, mouth sores, nosebleeds and sore throat.   Respiratory:  Negative for cough, hemoptysis and shortness of breath.   Cardiovascular:  Negative for chest pain, leg swelling and palpitations.  Gastrointestinal:  Positive for constipation (intermittent). Negative for abdominal pain, blood in stool, diarrhea, nausea and vomiting.  Endocrine: Negative for hot flashes.  Genitourinary:  Negative for difficulty urinating, dysuria, frequency, hematuria and vaginal bleeding.   Musculoskeletal:  Negative for arthralgias, back pain, gait problem, myalgias and neck pain.  Skin:  Negative for rash.  Neurological:  Negative for dizziness, extremity weakness, gait problem, headaches, light-headedness and numbness.  Hematological:  Negative for adenopathy. Does not bruise/bleed easily.  Psychiatric/Behavioral:  Negative for depression and sleep disturbance. The patient is not nervous/anxious.      VITALS:  Blood pressure (!) 140/68, pulse 80, temperature 97.9 F (36.6 C), temperature source Oral, resp. rate 18, height 5' 3.9" (1.623 m), weight 154 lb 14.4 oz (70.3 kg), SpO2 99%.  Wt Readings from Last 3 Encounters:  05/26/23 154 lb 14.4 oz (70.3 kg)  05/22/23 155 lb (70.3 kg)  02/18/23 159 lb (72.1 kg)    Body mass index is 26.67 kg/m.  Performance status (ECOG): 1 - Symptomatic but completely ambulatory  PHYSICAL EXAM:  Physical Exam Vitals and nursing note reviewed.  Constitutional:      General: She is not in acute distress.    Appearance: Normal appearance.  HENT:     Head: Normocephalic and atraumatic.     Mouth/Throat:     Mouth: Mucous membranes are moist.     Pharynx: Oropharynx is clear. No oropharyngeal exudate or posterior oropharyngeal erythema.  Eyes:     General: No scleral icterus.    Extraocular Movements: Extraocular movements intact.     Conjunctiva/sclera:  Conjunctivae normal.     Pupils: Pupils are equal, round, and reactive to light.  Cardiovascular:     Rate and Rhythm: Normal rate and regular rhythm.     Heart sounds: Normal heart sounds. No murmur heard.    No friction rub. No gallop.  Pulmonary:     Effort: Pulmonary effort is normal.     Breath sounds: Normal breath sounds. No wheezing, rhonchi or rales.  Chest:  Breasts:    Right: Swelling and tenderness (upper outer quadrant) present. No bleeding, inverted nipple, mass, nipple discharge or skin change.     Left: Normal. No swelling, bleeding, inverted nipple, mass, nipple discharge, skin change or tenderness.     Comments: Firmness/swelling and tenderness of the right upper outer quadrant Abdominal:     General: There is no distension.     Palpations: Abdomen is soft. There is no hepatomegaly, splenomegaly or mass.     Tenderness: There is no abdominal tenderness.  Musculoskeletal:        General: Normal range of motion.     Cervical back: Normal range of motion and neck supple. No tenderness.     Right lower leg: No edema.     Left lower leg:  No edema.  Lymphadenopathy:     Cervical: No cervical adenopathy.     Upper Body:     Right upper body: No supraclavicular or axillary adenopathy.     Left upper body: No supraclavicular or axillary adenopathy.     Lower Body: No right inguinal adenopathy. No left inguinal adenopathy.  Skin:    General: Skin is warm and dry.     Coloration: Skin is not jaundiced.     Findings: No rash.  Neurological:     Mental Status: She is alert and oriented to person, place, and time.     Cranial Nerves: No cranial nerve deficit.  Psychiatric:        Mood and Affect: Mood normal.        Behavior: Behavior normal.        Thought Content: Thought content normal.     LABS:      Latest Ref Rng & Units 05/26/2023    8:51 AM 05/22/2023   11:28 AM 06/11/2005    1:44 PM  CBC  WBC 4.0 - 10.5 K/uL 5.4  7.3  7.2   Hemoglobin 12.0 - 15.0 g/dL 16.1   09.6  04.5   Hematocrit 36.0 - 46.0 % 37.4  36.3  39.1   Platelets 150 - 400 K/uL 299  282.0  275       Latest Ref Rng & Units 05/22/2023   11:28 AM 06/11/2005    1:44 PM  CMP  Glucose 70 - 99 mg/dL 409  811   BUN 6 - 23 mg/dL 16  14   Creatinine 9.14 - 1.20 mg/dL 7.82  1.0   Sodium 956 - 145 mEq/L 141  140   Potassium 3.5 - 5.1 mEq/L 4.4  3.7   Chloride 96 - 112 mEq/L 106  104   CO2 19 - 32 mEq/L 28  26   Calcium 8.4 - 10.5 mg/dL 9.3  9.0   Total Protein 6.0 - 8.3 g/dL 6.8  6.5   Total Bilirubin 0.2 - 1.2 mg/dL 0.4  0.4   Alkaline Phos 39 - 117 U/L 69  78   AST 0 - 37 U/L 17  24   ALT 0 - 35 U/L 11  19      Lab Results  Component Value Date   TIBC 425.6 05/22/2023   FERRITIN 6.5 (L) 05/22/2023   IRONPCTSAT 7.5 (L) 05/22/2023   Lab Results  Component Value Date   LDH 173 06/11/2005    STUDIES:  No results found.    HISTORY:   Past Medical History:  Diagnosis Date   Age-related osteoporosis without current pathological fracture    Anemia    Atherosclerosis of vertebral artery    Cervical spinal stenosis    Degenerative cervical disc    Essential hypertension    Family history of breast cancer    Family history of kidney cancer    Family history of stomach cancer    GERD (gastroesophageal reflux disease)    History of cardiac arrhythmia    History of hypothyroidism    Kidney cyst, acquired    Personal history of breast cancer    Personal history of chemotherapy    Personal history of radiation therapy    Recurrent UTI    RLS (restless legs syndrome)    S/p left hip fracture    Tinnitus    Vitamin B12 deficiency     Past Surgical History:  Procedure Laterality Date   BREAST LUMPECTOMY Right  2007   x2   CHOLECYSTECTOMY     left hip fracture Left    PARTIAL HYSTERECTOMY      Family History  Problem Relation Age of Onset   Kidney cancer Mother        dx in her 36s   Congestive Heart Failure Father    Lung cancer Brother        smoker    Stomach cancer Maternal Grandmother        dx in her 67s   Cirrhosis Maternal Aunt    Cirrhosis Maternal Uncle    Cirrhosis Maternal Uncle    Breast cancer Cousin 25       maternal first cousin   Breast cancer Cousin        dx in her 35s; maternal first cousin   Breast cancer Cousin        dx in her 48s, maternal first cousin   Liver disease Neg Hx    Esophageal cancer Neg Hx    Colon cancer Neg Hx     Social History:  reports that she has never smoked. She has never used smokeless tobacco. She reports that she does not drink alcohol and does not use drugs.The patient is alone today.  Allergies:  Allergies  Allergen Reactions   Codeine Itching    Current Medications: Current Outpatient Medications  Medication Sig Dispense Refill   fluticasone (FLONASE) 50 MCG/ACT nasal spray Place 1 spray into both nostrils daily.     amLODipine (NORVASC) 5 MG tablet Take by mouth.     aspirin EC 81 MG tablet Take by mouth.     cyanocobalamin (VITAMIN B12) 1000 MCG/ML injection Inject into the muscle.     ferrous sulfate 325 (65 FE) MG tablet Take 325 mg by mouth daily with breakfast. (Patient not taking: Reported on 05/26/2023)     pantoprazole (PROTONIX) 20 MG tablet Take 20 mg by mouth daily.     rOPINIRole (REQUIP) 0.5 MG tablet Take 0.5 mg by mouth at bedtime. (Patient not taking: Reported on 05/26/2023)     No current facility-administered medications for this visit.     ASSESSMENT & PLAN:   Assessment/Plan:  Taffany Heiser is a 77 y.o. female   1.  New iron deficiency anemia. GI evaluation is pending.  She has had a good response to oral iron supplement, but does not tolerate this well.  We discussed the adverse effects of IV iron replacement, including allergic reaction, low blood pressure, shortness of breath, as well as more common side effects of flushing, headache, joint and muscle pain, nausea or pain, redness, or irritation at injection site.  She is agreeable to  proceed.  I will arrange for her to have IV iron in the form of Feraheme in the upcoming days.  2.  History of B12 deficiency. She continues B12 monthly at Dr. Ruby Corporal office.  3.  Remote history of stage IIB hormone receptor positive breast cancer treated with lumpectomy, adjuvant radiation and partial adjuvant endocrine therapy.  She is overdue for annual screening mammogram, but has tenderness and firmness in the right upper outer quadrant, so will order bilateral diagnostic mammogram. I will get that scheduled at [facility].    I discussed the assessment and treatment plan with the patient. I will plan to see her back in 2 months with a CBC, iron panel and ferritin. The patient was provided an opportunity to ask questions and all were answered.  The patient  agreed with the plan and demonstrated an understanding of the instructions.    Thank you for the referral    45 minutes was spent in patient care.  This included time spent preparing to see the patient (e.g., review of tests), obtaining and/or reviewing separately obtained history, counseling and educating the patient/family/caregiver, ordering medications, tests, or procedures; documenting clinical information in the electronic or other health record, independently interpreting results and communicating results to the patient/family/caregiver as well as coordination of care.      Alfonso Ike, PA-C   Physician Assistant [facility] [phone]

## 2023-05-26 ENCOUNTER — Encounter: Payer: Self-pay | Admitting: Hematology and Oncology

## 2023-05-26 ENCOUNTER — Inpatient Hospital Stay

## 2023-05-26 ENCOUNTER — Telehealth: Payer: Self-pay | Admitting: Hematology and Oncology

## 2023-05-26 ENCOUNTER — Inpatient Hospital Stay: Attending: Hematology and Oncology | Admitting: Hematology and Oncology

## 2023-05-26 VITALS — BP 140/68 | HR 80 | Temp 97.9°F | Resp 18 | Ht 63.9 in | Wt 154.9 lb

## 2023-05-26 DIAGNOSIS — E538 Deficiency of other specified B group vitamins: Secondary | ICD-10-CM | POA: Diagnosis not present

## 2023-05-26 DIAGNOSIS — N644 Mastodynia: Secondary | ICD-10-CM | POA: Diagnosis not present

## 2023-05-26 DIAGNOSIS — Z8051 Family history of malignant neoplasm of kidney: Secondary | ICD-10-CM | POA: Diagnosis not present

## 2023-05-26 DIAGNOSIS — D509 Iron deficiency anemia, unspecified: Secondary | ICD-10-CM | POA: Diagnosis not present

## 2023-05-26 DIAGNOSIS — I10 Essential (primary) hypertension: Secondary | ICD-10-CM | POA: Diagnosis not present

## 2023-05-26 DIAGNOSIS — Z801 Family history of malignant neoplasm of trachea, bronchus and lung: Secondary | ICD-10-CM | POA: Diagnosis not present

## 2023-05-26 DIAGNOSIS — Z79899 Other long term (current) drug therapy: Secondary | ICD-10-CM | POA: Diagnosis not present

## 2023-05-26 DIAGNOSIS — Z1231 Encounter for screening mammogram for malignant neoplasm of breast: Secondary | ICD-10-CM

## 2023-05-26 DIAGNOSIS — Z803 Family history of malignant neoplasm of breast: Secondary | ICD-10-CM | POA: Insufficient documentation

## 2023-05-26 DIAGNOSIS — Z9221 Personal history of antineoplastic chemotherapy: Secondary | ICD-10-CM | POA: Insufficient documentation

## 2023-05-26 DIAGNOSIS — Z923 Personal history of irradiation: Secondary | ICD-10-CM | POA: Insufficient documentation

## 2023-05-26 DIAGNOSIS — Z853 Personal history of malignant neoplasm of breast: Secondary | ICD-10-CM | POA: Diagnosis not present

## 2023-05-26 DIAGNOSIS — Z800 Family history of malignant neoplasm of digestive organs: Secondary | ICD-10-CM

## 2023-05-26 DIAGNOSIS — N63 Unspecified lump in unspecified breast: Secondary | ICD-10-CM

## 2023-05-26 LAB — CBC WITH DIFFERENTIAL (CANCER CENTER ONLY)
Abs Immature Granulocytes: 0.01 10*3/uL (ref 0.00–0.07)
Basophils Absolute: 0.1 10*3/uL (ref 0.0–0.1)
Basophils Relative: 1 %
Eosinophils Absolute: 0.1 10*3/uL (ref 0.0–0.5)
Eosinophils Relative: 2 %
HCT: 37.4 % (ref 36.0–46.0)
Hemoglobin: 12.1 g/dL (ref 12.0–15.0)
Immature Granulocytes: 0 %
Lymphocytes Relative: 29 %
Lymphs Abs: 1.6 10*3/uL (ref 0.7–4.0)
MCH: 26.6 pg (ref 26.0–34.0)
MCHC: 32.4 g/dL (ref 30.0–36.0)
MCV: 82.2 fL (ref 80.0–100.0)
Monocytes Absolute: 0.3 10*3/uL (ref 0.1–1.0)
Monocytes Relative: 5 %
Neutro Abs: 3.5 10*3/uL (ref 1.7–7.7)
Neutrophils Relative %: 63 %
Platelet Count: 299 10*3/uL (ref 150–400)
RBC: 4.55 MIL/uL (ref 3.87–5.11)
RDW: 15.6 % — ABNORMAL HIGH (ref 11.5–15.5)
WBC Count: 5.4 10*3/uL (ref 4.0–10.5)
nRBC: 0 % (ref 0.0–0.2)
nRBC: 0 /100{WBCs}

## 2023-05-26 LAB — RETICULOCYTES
Immature Retic Fract: 13.3 % (ref 2.3–15.9)
RBC.: 4.56 MIL/uL (ref 3.87–5.11)
Retic Count, Absolute: 66.1 10*3/uL (ref 19.0–186.0)
Retic Ct Pct: 1.5 % (ref 0.4–3.1)

## 2023-05-26 LAB — TECHNOLOGIST SMEAR REVIEW: Plt Morphology: NORMAL

## 2023-05-26 NOTE — Telephone Encounter (Signed)
 Patient has been scheduled for follow-up visit per 05/26/23 LOS.  Pt given an appt calendar with date and time.

## 2023-05-27 ENCOUNTER — Other Ambulatory Visit: Payer: Self-pay | Admitting: Hematology and Oncology

## 2023-05-27 DIAGNOSIS — N63 Unspecified lump in unspecified breast: Secondary | ICD-10-CM

## 2023-05-27 DIAGNOSIS — Z853 Personal history of malignant neoplasm of breast: Secondary | ICD-10-CM

## 2023-05-27 DIAGNOSIS — N644 Mastodynia: Secondary | ICD-10-CM

## 2023-05-28 ENCOUNTER — Inpatient Hospital Stay

## 2023-05-28 VITALS — BP 150/70 | HR 72 | Temp 97.7°F | Resp 18

## 2023-05-28 DIAGNOSIS — D509 Iron deficiency anemia, unspecified: Secondary | ICD-10-CM | POA: Diagnosis not present

## 2023-05-28 LAB — PROTEIN ELECTROPHORESIS, SERUM, WITH REFLEX
A/G Ratio: 1.2 (ref 0.7–1.7)
Albumin ELP: 3.7 g/dL (ref 2.9–4.4)
Alpha-1-Globulin: 0.3 g/dL (ref 0.0–0.4)
Alpha-2-Globulin: 0.7 g/dL (ref 0.4–1.0)
Beta Globulin: 1.1 g/dL (ref 0.7–1.3)
Gamma Globulin: 1.1 g/dL (ref 0.4–1.8)
Globulin, Total: 3.1 g/dL (ref 2.2–3.9)
Total Protein ELP: 6.8 g/dL (ref 6.0–8.5)

## 2023-05-28 MED ORDER — SODIUM CHLORIDE 0.9 % IV SOLN: INTRAVENOUS | Status: DC

## 2023-05-28 MED ORDER — LORATADINE 10 MG PO TABS
10.0000 mg | ORAL_TABLET | Freq: Once | ORAL | Status: AC
  Administered 2023-05-28: 10 mg via ORAL
  Filled 2023-05-28: qty 1

## 2023-05-28 MED ORDER — SODIUM CHLORIDE 0.9 % IV SOLN
510.0000 mg | Freq: Once | INTRAVENOUS | Status: AC
  Administered 2023-05-28: 510 mg via INTRAVENOUS
  Filled 2023-05-28: qty 510

## 2023-05-28 MED ORDER — ACETAMINOPHEN 325 MG PO TABS
650.0000 mg | ORAL_TABLET | Freq: Once | ORAL | Status: AC
  Administered 2023-05-28: 650 mg via ORAL
  Filled 2023-05-28: qty 2

## 2023-05-28 NOTE — Patient Instructions (Signed)

## 2023-06-01 ENCOUNTER — Inpatient Hospital Stay

## 2023-06-01 VITALS — BP 137/69 | HR 64 | Temp 97.6°F | Resp 18

## 2023-06-01 DIAGNOSIS — D509 Iron deficiency anemia, unspecified: Secondary | ICD-10-CM

## 2023-06-01 MED ORDER — LORATADINE 10 MG PO TABS
10.0000 mg | ORAL_TABLET | Freq: Once | ORAL | Status: AC
  Administered 2023-06-01: 10 mg via ORAL
  Filled 2023-06-01: qty 1

## 2023-06-01 MED ORDER — ACETAMINOPHEN 325 MG PO TABS
650.0000 mg | ORAL_TABLET | Freq: Once | ORAL | Status: AC
  Administered 2023-06-01: 650 mg via ORAL
  Filled 2023-06-01: qty 2

## 2023-06-01 MED ORDER — SODIUM CHLORIDE 0.9 % IV SOLN: INTRAVENOUS | Status: DC

## 2023-06-01 MED ORDER — SODIUM CHLORIDE 0.9 % IV SOLN
510.0000 mg | Freq: Once | INTRAVENOUS | Status: AC
  Administered 2023-06-01: 510 mg via INTRAVENOUS
  Filled 2023-06-01: qty 510

## 2023-06-01 NOTE — Patient Instructions (Signed)

## 2023-06-04 ENCOUNTER — Ambulatory Visit (INDEPENDENT_AMBULATORY_CARE_PROVIDER_SITE_OTHER)
Admission: RE | Admit: 2023-06-04 | Discharge: 2023-06-04 | Disposition: A | Discharge status: Home / Self Care | Source: Ambulatory Visit | Attending: Gastroenterology | Admitting: Gastroenterology

## 2023-06-04 DIAGNOSIS — D509 Iron deficiency anemia, unspecified: Secondary | ICD-10-CM | POA: Diagnosis not present

## 2023-06-04 DIAGNOSIS — R1032 Left lower quadrant pain: Secondary | ICD-10-CM | POA: Diagnosis not present

## 2023-06-04 DIAGNOSIS — K5900 Constipation, unspecified: Secondary | ICD-10-CM | POA: Diagnosis not present

## 2023-06-04 DIAGNOSIS — Z853 Personal history of malignant neoplasm of breast: Secondary | ICD-10-CM | POA: Diagnosis not present

## 2023-06-04 MED ORDER — IOHEXOL 300 MG/ML  SOLN
100.0000 mL | Freq: Once | INTRAMUSCULAR | Status: AC | PRN
  Administered 2023-06-04: 100 mL via INTRAVENOUS

## 2023-07-21 ENCOUNTER — Encounter: Admitting: Gastroenterology

## 2023-07-28 ENCOUNTER — Telehealth: Payer: Self-pay

## 2023-07-28 ENCOUNTER — Inpatient Hospital Stay: Attending: Hematology and Oncology | Admitting: Hematology and Oncology

## 2023-07-28 ENCOUNTER — Encounter: Payer: Self-pay | Admitting: Hematology and Oncology

## 2023-07-28 ENCOUNTER — Inpatient Hospital Stay

## 2023-07-28 VITALS — BP 124/65 | HR 80 | Temp 97.9°F | Resp 20 | Ht 63.9 in | Wt 154.0 lb

## 2023-07-28 DIAGNOSIS — Z79899 Other long term (current) drug therapy: Secondary | ICD-10-CM | POA: Diagnosis not present

## 2023-07-28 DIAGNOSIS — Z803 Family history of malignant neoplasm of breast: Secondary | ICD-10-CM | POA: Diagnosis not present

## 2023-07-28 DIAGNOSIS — Z923 Personal history of irradiation: Secondary | ICD-10-CM | POA: Insufficient documentation

## 2023-07-28 DIAGNOSIS — Z853 Personal history of malignant neoplasm of breast: Secondary | ICD-10-CM | POA: Diagnosis not present

## 2023-07-28 DIAGNOSIS — Z801 Family history of malignant neoplasm of trachea, bronchus and lung: Secondary | ICD-10-CM | POA: Insufficient documentation

## 2023-07-28 DIAGNOSIS — D509 Iron deficiency anemia, unspecified: Secondary | ICD-10-CM

## 2023-07-28 DIAGNOSIS — Z9221 Personal history of antineoplastic chemotherapy: Secondary | ICD-10-CM | POA: Diagnosis not present

## 2023-07-28 DIAGNOSIS — E538 Deficiency of other specified B group vitamins: Secondary | ICD-10-CM | POA: Insufficient documentation

## 2023-07-28 LAB — CBC WITH DIFFERENTIAL (CANCER CENTER ONLY)
Abs Immature Granulocytes: 0.01 10*3/uL (ref 0.00–0.07)
Basophils Absolute: 0 10*3/uL (ref 0.0–0.1)
Basophils Relative: 1 %
Eosinophils Absolute: 0.1 10*3/uL (ref 0.0–0.5)
Eosinophils Relative: 1 %
HCT: 37.8 % (ref 36.0–46.0)
Hemoglobin: 12.1 g/dL (ref 12.0–15.0)
Immature Granulocytes: 0 %
Lymphocytes Relative: 25 %
Lymphs Abs: 1.5 10*3/uL (ref 0.7–4.0)
MCH: 27.7 pg (ref 26.0–34.0)
MCHC: 32 g/dL (ref 30.0–36.0)
MCV: 86.5 fL (ref 80.0–100.0)
Monocytes Absolute: 0.2 10*3/uL (ref 0.1–1.0)
Monocytes Relative: 4 %
Neutro Abs: 4.2 10*3/uL (ref 1.7–7.7)
Neutrophils Relative %: 69 %
Platelet Count: 268 10*3/uL (ref 150–400)
RBC: 4.37 MIL/uL (ref 3.87–5.11)
RDW: 13.7 % (ref 11.5–15.5)
WBC Count: 6 10*3/uL (ref 4.0–10.5)
nRBC: 0 % (ref 0.0–0.2)

## 2023-07-28 LAB — IRON AND TIBC
Iron: 58 ug/dL (ref 28–170)
Saturation Ratios: 16 % (ref 10.4–31.8)
TIBC: 358 ug/dL (ref 250–450)
UIBC: 300 ug/dL

## 2023-07-28 LAB — FERRITIN: Ferritin: 25 ng/mL (ref 11–307)

## 2023-07-28 NOTE — Assessment & Plan Note (Addendum)
 Iron deficiency anemia diagnosed in December 2024.  The patient reported difficulties tolerating oral iron mainly due to constipation.  She received IV iron in the form of Feraheme in April with a good response.  Unfortunately, she did not get her EGD and colonoscopy done.  I encouraged her to do so as soon as possible.  Iron studies have improved with IV Feraheme, but still low normal.  I will plan to see her back in 2 months with a CBC, comprehensive metabolic panel, iron/TIBC and ferritin.

## 2023-07-28 NOTE — Progress Notes (Signed)
 Marissa Black  [address] [place],  [place]  [address] [phone]  Clinic Day:  07/28/2023  Referring physician: Abbe Hoard., MD  ASSESSMENT & PLAN:   Assessment & Plan: Iron deficiency anemia Iron deficiency anemia diagnosed in December 2024.  The patient reported difficulties tolerating oral iron mainly due to constipation.  She received IV iron in the form of Feraheme in April with a good response.  Unfortunately, she did not get her EGD and colonoscopy done.  I encouraged her to do so as soon as possible.  Iron studies have improved with IV Feraheme, but still low normal.  I will plan to see her back in 2 months with a CBC, comprehensive metabolic panel, iron/TIBC and ferritin.  B12 deficiency History of B12 deficiency. She continues B12 monthly at Dr. Ruby Corporal office.   Personal history of breast cancer Remote history of stage IIB hormone receptor positive breast cancer diagnosed in March 2007. She treated with lumpectomy, adjuvant radiation and partial adjuvant endocrine therapy.  She has persistent tenderness and firmness in the right upper outer quadrant. I ordered a bilateral diagnostic mammogram and right breast ultrasound back in April at [facility].  We will attempt to schedule that again.    The patient understands the plans discussed today and is in agreement with them.  She knows to contact our office if she develops concerns prior to her next appointment.   I provided 20 minutes of face-to-face time during this encounter and > 50% was spent counseling as documented under my assessment and plan.    Xitlali Kastens A Caymen Dubray, PA-C  [facility] [place] - [facility] DEPT OF [facility] [address] [place] [address] Dept: [phone] Dept Fax: [phone]   No orders of the defined types were placed in this encounter.     CHIEF COMPLAINT:  CC: Iron deficiency anemia  Current  Treatment: IV iron as needed  HISTORY OF PRESENT ILLNESS:  Marissa Black is a 78 y.o. female with iron deficiency who was referred in consultation by Dr. Lajuan Pila for assessment and management.  He saw her on April 11 for consideration of GI evaluation.  CBC revealed 11.9 with an MCV of 81, WBC 7.3 with a normal differential and platelets 282,000.  She had worsening iron was 32, TIBC 304 and iron saturation 6.5% and ferritin 7.5 despite oral iron supplement.   She was scheduled for CT abdomen and pelvis April 24, with plans for EGD and colonoscopy.    She saw Dr. Eluterio Hamburg on December 30 for routine follow-up.  CBC without differential revealed WBCs 5.4, hemoglobin 8.5 with an MCV 71, and platelets 362,000.  CMP was unremarkable. She was called to come back for additional testing, but to the emergency room if she experiences any bleeding.  She did end up going to to [facility] ER on January 2 with a conjunctival hemorrhage.  CBC revealed WBC 6.9, hemoglobin 9.3, MCV 78, platelets 389,000.  She had evidence of iron deficiency with serum iron 22, TIBC 466, saturation 4.7% and ferritin 5.9.  Stool Hemoccult was negative. She was started on ferrous sulfate daily.  She was seen again by Darrelyn Ely at Dr. Ruby Corporal office on January 27.  CBC revealed hemoglobin 9.4 with an MCV of 71, WBCs 2.7, with 63% neutrophils, 38% lymphocytes, 8% monocytes, and 1% basophils, and platelets 277,000.  Iron studies were equivocal with iron 201, TIBC 442  and iron saturation 45%.  Ferritin was 14. Oral iron was continued.   Of note, she also has a history of B12 deficiency and continues B12 monthly at Dr. Ruby Corporal office.  She also has a remote history of stage IIB hormone and HER2 receptor positive breast cancer treated with lumpectomy, adjuvant chemotherapy/HER2 targeted therapy and adjuvant radiation. She was placed on anastrozole in November 2008, but but stopped it on her own in March 2010 because of  increasing arthralgias.  Due to her personal and family history of malignancy, she underwent genetic testing for hereditary cancer syndromes with the [employer] multi cancer panel test in March 2018.  This did not reveal any clinically significant mutations or variants of uncertain significance.  Her last mammogram was in August 2023 and did not reveal any evidence of malignancy.  Dr. Almer Jacobson discharged her from clinic last year, as she follows closely with Dr. Eluterio Hamburg and he had been ordering her mammograms.    The patient reported difficulty tolerating oral iron due to constipation.  She is on aspirin for atherosclerosis and stated Dr. Venice Gillis did not recommend holding her aspirin.  She received IV iron in the form of Feraheme in April.  INTERVAL HISTORY:   Marissa Black is here today for repeat clinical assessment.  She states she is feeling better since receiving IV Feraheme.  She denies any overt form of blood loss.  She denies fevers or chills. She denies pain. Her appetite is good. Her weight has been stable.  CT abdomen and pelvis in April did not reveal any acute findings.  Extensive colonic diverticulosis without diverticulitis and a tiny hiatal hernia were seen.  She states she canceled her EGD and colonoscopy as her daughter became very ill and still requires surgery.  I have ordered a bilateral diagnostic mammogram and right breast ultrasound, but that was never scheduled.  REVIEW OF SYSTEMS:   Review of Systems  Constitutional:  Positive for fatigue. Negative for appetite change, chills, fever and unexpected weight change.  HENT:   Negative for lump/mass, mouth sores and sore throat.   Respiratory:  Negative for cough, hemoptysis and shortness of breath.   Cardiovascular:  Negative for chest pain and leg swelling.  Gastrointestinal:  Positive for constipation. Negative for abdominal pain, blood in stool, diarrhea, nausea and vomiting.  Genitourinary:  Negative for difficulty urinating, dysuria,  frequency, hematuria and vaginal bleeding.   Musculoskeletal:  Negative for arthralgias, back pain and myalgias.  Skin:  Negative for rash.  Neurological:  Negative for dizziness and headaches.  Hematological:  Negative for adenopathy. Bruises/bleeds easily (easy bruising).  Psychiatric/Behavioral:  Negative for depression and sleep disturbance. The patient is not nervous/anxious.      VITALS:   Blood pressure 124/65, pulse 80, temperature 97.9 F (36.6 C), temperature source Oral, resp. rate 20, height 5' 3.9 (1.623 m), weight 154 lb (69.9 kg), SpO2 97%.  Wt Readings from Last 3 Encounters:  07/28/23 154 lb (69.9 kg)  05/26/23 154 lb 14.4 oz (70.3 kg)  05/22/23 155 lb (70.3 kg)    Body mass index is 26.52 kg/m.  Performance status (ECOG): 1 - Symptomatic but completely ambulatory    PHYSICAL EXAM:   Physical Exam Vitals and nursing note reviewed.  Constitutional:      General: She is not in acute distress.    Appearance: Normal appearance.  HENT:     Head: Normocephalic and atraumatic.     Mouth/Throat:     Mouth: Mucous membranes are moist.  Pharynx: Oropharynx is clear. No oropharyngeal exudate or posterior oropharyngeal erythema.   Eyes:     General: No scleral icterus.    Extraocular Movements: Extraocular movements intact.     Conjunctiva/sclera: Conjunctivae normal.     Pupils: Pupils are equal, round, and reactive to light.    Cardiovascular:     Rate and Rhythm: Normal rate and regular rhythm.     Heart sounds: Normal heart sounds. No murmur heard.    No friction rub. No gallop.  Pulmonary:     Effort: Pulmonary effort is normal.     Breath sounds: Normal breath sounds. No wheezing, rhonchi or rales.  Chest:  Breasts:    Right: Swelling (persistent swelling and tenderness right upper outer quadrant) and tenderness present. No bleeding, inverted nipple, mass, nipple discharge or skin change.     Left: Normal. No swelling, bleeding, inverted nipple,  mass, nipple discharge, skin change or tenderness.  Abdominal:     General: There is no distension.     Palpations: Abdomen is soft. There is no hepatomegaly, splenomegaly or mass.     Tenderness: There is no abdominal tenderness.   Musculoskeletal:        General: Normal range of motion.     Cervical back: Normal range of motion and neck supple. No tenderness.     Right lower leg: No edema.     Left lower leg: No edema.  Lymphadenopathy:     Cervical: No cervical adenopathy.     Upper Body:     Right upper body: No supraclavicular or axillary adenopathy.     Left upper body: No supraclavicular or axillary adenopathy.     Lower Body: No right inguinal adenopathy. No left inguinal adenopathy.   Skin:    General: Skin is warm and dry.     Coloration: Skin is not jaundiced.     Findings: No rash.   Neurological:     Mental Status: She is alert and oriented to person, place, and time.     Cranial Nerves: No cranial nerve deficit.   Psychiatric:        Mood and Affect: Mood normal.        Behavior: Behavior normal.        Thought Content: Thought content normal.     LABS:      Latest Ref Rng & Units 07/28/2023   10:57 AM 05/26/2023    8:51 AM 05/22/2023   11:28 AM  CBC  WBC 4.0 - 10.5 K/uL 6.0  5.4  7.3   Hemoglobin 12.0 - 15.0 g/dL 82.9  56.2  13.0   Hematocrit 36.0 - 46.0 % 37.8  37.4  36.3   Platelets 150 - 400 K/uL 268  299  282.0       Latest Ref Rng & Units 05/22/2023   11:28 AM 06/11/2005    1:44 PM  CMP  Glucose 70 - 99 mg/dL 865  784   BUN 6 - 23 mg/dL 16  14   Creatinine 6.96 - 1.20 mg/dL 2.95  1.0   Sodium 284 - 145 mEq/L 141  140   Potassium 3.5 - 5.1 mEq/L 4.4  3.7   Chloride 96 - 112 mEq/L 106  104   CO2 19 - 32 mEq/L 28  26   Calcium 8.4 - 10.5 mg/dL 9.3  9.0   Total Protein 6.0 - 8.3 g/dL 6.8  6.5   Total Bilirubin 0.2 - 1.2 mg/dL 0.4  0.4   Alkaline  Phos 39 - 117 U/L 69  78   AST 0 - 37 U/L 17  24   ALT 0 - 35 U/L 11  19      Lab Results   Component Value Date   TOTALPROTELP 6.8 05/26/2023   ALBUMINELP 3.7 05/26/2023   A1GS 0.3 05/26/2023   A2GS 0.7 05/26/2023   BETS 1.1 05/26/2023   GAMS 1.1 05/26/2023   MSPIKE Not Observed 05/26/2023   Lab Results  Component Value Date   TIBC 358 07/28/2023   TIBC 425.6 05/22/2023   FERRITIN 25 07/28/2023   FERRITIN 6.5 (L) 05/22/2023   IRONPCTSAT 16 07/28/2023   IRONPCTSAT 7.5 (L) 05/22/2023   Lab Results  Component Value Date   LDH 173 06/11/2005    STUDIES:   No results found.    HISTORY:   Past Medical History:  Diagnosis Date   Age-related osteoporosis without current pathological fracture    Anemia    Atherosclerosis of vertebral artery    Cervical spinal stenosis    Degenerative cervical disc    Essential hypertension    Family history of breast cancer    Family history of kidney cancer    Family history of stomach cancer    GERD (gastroesophageal reflux disease)    History of cardiac arrhythmia    History of hypothyroidism    Kidney cyst, acquired    Personal history of breast cancer    Personal history of chemotherapy    Personal history of radiation therapy    Recurrent UTI    RLS (restless legs syndrome)    S/p left hip fracture    Tinnitus    Vitamin B12 deficiency     Past Surgical History:  Procedure Laterality Date   BREAST LUMPECTOMY Right 2007   x2   CHOLECYSTECTOMY     left hip fracture Left    PARTIAL HYSTERECTOMY      Family History  Problem Relation Age of Onset   Kidney cancer Mother        dx in her 55s   Congestive Heart Failure Father    Lung cancer Brother        smoker   Stomach cancer Maternal Grandmother        dx in her 28s   Cirrhosis Maternal Aunt    Cirrhosis Maternal Uncle    Cirrhosis Maternal Uncle    Breast cancer Cousin 25       maternal first cousin   Breast cancer Cousin        dx in her 93s; maternal first cousin   Breast cancer Cousin        dx in her 42s, maternal first cousin   Liver  disease Neg Hx    Esophageal cancer Neg Hx    Colon cancer Neg Hx     Social History:  reports that she has never smoked. She has never used smokeless tobacco. She reports that she does not drink alcohol and does not use drugs.The patient is alone today.  Allergies:  Allergies  Allergen Reactions   Codeine Itching    Current Medications: Current Outpatient Medications  Medication Sig Dispense Refill   pantoprazole (PROTONIX) 40 MG tablet Take 40 mg by mouth daily.     amLODipine (NORVASC) 5 MG tablet Take by mouth.     aspirin EC 81 MG tablet Take by mouth.     cyanocobalamin  (VITAMIN B12) 1000 MCG/ML injection Inject into the muscle.     ferrous sulfate 325 (  65 FE) MG tablet Take 325 mg by mouth daily with breakfast. (Patient not taking: Reported on 05/26/2023)     fluticasone  (FLONASE ) 50 MCG/ACT nasal spray Place 1 spray into both nostrils daily.     rOPINIRole (REQUIP) 0.5 MG tablet Take 0.5 mg by mouth at bedtime. (Patient not taking: Reported on 05/26/2023)     No current facility-administered medications for this visit.

## 2023-07-28 NOTE — Assessment & Plan Note (Signed)
 Remote history of stage IIB hormone receptor positive breast cancer diagnosed in March 2007. She treated with lumpectomy, adjuvant radiation and partial adjuvant endocrine therapy.  She has persistent tenderness and firmness in the right upper outer quadrant. I ordered a bilateral diagnostic mammogram and right breast ultrasound back in April at [facility].  We will attempt to schedule that again.

## 2023-07-28 NOTE — Telephone Encounter (Signed)
 Detailed message left for patient.

## 2023-07-28 NOTE — Telephone Encounter (Signed)
-----   Message from Alfonso Ike sent at 07/28/2023  3:16 PM EDT ----- Please let her know her iron levels are better. I asked scheduling to have her f/u in 2 months with labs. Thanks

## 2023-07-28 NOTE — Assessment & Plan Note (Signed)
 History of B12 deficiency. She continues B12 monthly at Dr. Ruby Corporal office.

## 2023-07-29 ENCOUNTER — Telehealth: Payer: Self-pay

## 2023-07-29 ENCOUNTER — Telehealth: Payer: Self-pay | Admitting: Hematology and Oncology

## 2023-07-29 NOTE — Telephone Encounter (Signed)
 Detailed message left for patient.

## 2023-07-29 NOTE — Telephone Encounter (Signed)
 Appts have been scheduled for 7m follow up. Pt was also given [facility] phone number so that she can call to schedule Diagnostic Mammogram and US  appt since they have been unable to reach her to schedule.

## 2023-07-29 NOTE — Telephone Encounter (Signed)
-----   Message from Alfonso Ike sent at 07/28/2023  3:55 PM EDT ----- Thank you ----- Message ----- From: Coralyn Derry Sent: 07/28/2023   3:46 PM EDT To: Alfonso Ike, PA-C; Luevenia Saha  Will call pt to schedule.  Looks like they tried calling her back in April with no success.  06/02/23- 2ND ATTEMPT UNABLE TO LVM NOT SET UP YET -DL 1/32/44 - 1ST ATTEMPT, VM NOT SET UP; UNABLE TO LVM //SH  ----- Message ----- From: Alfonso Ike, PA-C Sent: 07/28/2023   3:17 PM EDT To: Damaris Figueroa; Luevenia Saha  1. Please schedule lab and f/u with Kelli in 2 months. 2. I ordered bilateral diagnostic mammo and right breast u/s in April. I'm not sure why it wasn't scheduled. [facility] with [facility], thanks

## 2023-07-29 NOTE — Telephone Encounter (Signed)
-----   Message from Marissa Black sent at 07/28/2023  3:16 PM EDT ----- Please let her know her iron levels are better. I asked scheduling to have her f/u in 2 months with labs. Thanks

## 2023-08-12 ENCOUNTER — Encounter: Admitting: Gastroenterology

## 2023-09-15 ENCOUNTER — Ambulatory Visit
Admission: RE | Admit: 2023-09-15 | Discharge: 2023-09-15 | Disposition: A | Discharge status: Home / Self Care | Source: Ambulatory Visit | Attending: Hematology and Oncology | Admitting: Hematology and Oncology

## 2023-09-15 DIAGNOSIS — N644 Mastodynia: Secondary | ICD-10-CM

## 2023-09-15 DIAGNOSIS — Z853 Personal history of malignant neoplasm of breast: Secondary | ICD-10-CM

## 2023-09-15 DIAGNOSIS — N63 Unspecified lump in unspecified breast: Secondary | ICD-10-CM

## 2023-09-25 ENCOUNTER — Other Ambulatory Visit: Payer: Self-pay | Admitting: Hematology and Oncology

## 2023-09-25 DIAGNOSIS — D509 Iron deficiency anemia, unspecified: Secondary | ICD-10-CM

## 2023-09-28 ENCOUNTER — Telehealth: Payer: Self-pay | Admitting: Hematology and Oncology

## 2023-09-28 ENCOUNTER — Encounter: Payer: Self-pay | Admitting: Hematology and Oncology

## 2023-09-28 ENCOUNTER — Inpatient Hospital Stay

## 2023-09-28 ENCOUNTER — Inpatient Hospital Stay: Attending: Hematology and Oncology | Admitting: Hematology and Oncology

## 2023-09-28 VITALS — BP 139/64 | HR 71 | Temp 98.0°F | Resp 18 | Ht 63.9 in | Wt 152.6 lb

## 2023-09-28 DIAGNOSIS — Z853 Personal history of malignant neoplasm of breast: Secondary | ICD-10-CM | POA: Diagnosis not present

## 2023-09-28 DIAGNOSIS — E538 Deficiency of other specified B group vitamins: Secondary | ICD-10-CM

## 2023-09-28 DIAGNOSIS — Z79899 Other long term (current) drug therapy: Secondary | ICD-10-CM | POA: Insufficient documentation

## 2023-09-28 DIAGNOSIS — D509 Iron deficiency anemia, unspecified: Secondary | ICD-10-CM

## 2023-09-28 LAB — CBC WITH DIFFERENTIAL (CANCER CENTER ONLY)
Abs Immature Granulocytes: 0.01 K/uL (ref 0.00–0.07)
Basophils Absolute: 0 K/uL (ref 0.0–0.1)
Basophils Relative: 1 %
Eosinophils Absolute: 0.1 K/uL (ref 0.0–0.5)
Eosinophils Relative: 1 %
HCT: 28.6 % — ABNORMAL LOW (ref 36.0–46.0)
Hemoglobin: 8.9 g/dL — ABNORMAL LOW (ref 12.0–15.0)
Immature Granulocytes: 0 %
Lymphocytes Relative: 26 %
Lymphs Abs: 1.1 K/uL (ref 0.7–4.0)
MCH: 25.3 pg — ABNORMAL LOW (ref 26.0–34.0)
MCHC: 31.1 g/dL (ref 30.0–36.0)
MCV: 81.3 fL (ref 80.0–100.0)
Monocytes Absolute: 0.2 K/uL (ref 0.1–1.0)
Monocytes Relative: 4 %
Neutro Abs: 2.9 K/uL (ref 1.7–7.7)
Neutrophils Relative %: 68 %
Platelet Count: 298 K/uL (ref 150–400)
RBC: 3.52 MIL/uL — ABNORMAL LOW (ref 3.87–5.11)
RDW: 12.4 % (ref 11.5–15.5)
WBC Count: 4.3 K/uL (ref 4.0–10.5)
nRBC: 0 % (ref 0.0–0.2)

## 2023-09-28 LAB — IRON AND TIBC
Iron: 19 ug/dL — ABNORMAL LOW (ref 28–170)
Saturation Ratios: 4 % — ABNORMAL LOW (ref 10.4–31.8)
TIBC: 462 ug/dL — ABNORMAL HIGH (ref 250–450)
UIBC: 443 ug/dL

## 2023-09-28 LAB — FERRITIN: Ferritin: 4 ng/mL — ABNORMAL LOW (ref 11–307)

## 2023-09-28 NOTE — Assessment & Plan Note (Signed)
 History of B12 deficiency. She continues B12 monthly at Dr. Ruby Corporal office.

## 2023-09-28 NOTE — Telephone Encounter (Signed)
 Patient has been scheduled for follow-up visit per 09/24/23 LOS.  Pt given an appt calendar with date and time.

## 2023-09-28 NOTE — Telephone Encounter (Signed)
 Patient has been scheduled for follow-up visit per 09/28/23 LOS.  Pt aware of scheduled appt details.

## 2023-09-28 NOTE — Progress Notes (Signed)
 [facility]  [address] [place],  [place]  [address] [phone]  Clinic Day:  09/28/2023  Referring physician: Thurmond Cathlyn LABOR., MD  ASSESSMENT & PLAN:   Assessment & Plan: Iron deficiency anemia Iron deficiency anemia diagnosed in December 2024, felt to be most likely due to chronic GI blood loss.  The patient reported difficulties tolerating oral iron mainly due to constipation.  She received IV iron in the form of Feraheme  in April with a good response.  Unfortunately, she did not get her EGD and colonoscopy done.  I encouraged her to do so as soon as possible.  She now has evidence of recurrent iron deficiency.  I will arrange for her to receive IV Feraheme  again at this time.  I will plan to see her back in 3 months with a CBC, iron/TIBC, ferritin  B12 deficiency History of B12 deficiency. She continues B12 monthly at Dr. Wyvonnia office.   Personal history of breast cancer Remote history of stage IIB hormone receptor positive breast cancer diagnosed in March 2007. She treated with lumpectomy, adjuvant radiation and partial adjuvant endocrine therapy.  Bilateral diagnostic mammogram and right breast ultrasound this month did not reveal any evidence of malignancy.  Will plan for bilateral screening mammogram in 1 year.     The patient understands the plans discussed today and is in agreement with them.  She knows to contact our office if she develops concerns prior to her next appointment.   I provided 20 minutes of face-to-face time during this encounter and > 50% was spent counseling as documented under my assessment and plan.    Andrez LABOR Foy, PA-C  [facility] [place] - A DEPT OF [facility] [address] [place] [address] Dept: [phone] Dept Fax: [phone]   Orders Placed This Encounter  Procedures   CBC with Differential (Cancer Center Only)    Standing Status:   Future    Expected  Date:   12/29/2023    Expiration Date:   03/28/2024   Ferritin    Standing Status:   Future    Expected Date:   12/29/2023    Expiration Date:   03/28/2024   Iron and TIBC    Standing Status:   Future    Expected Date:   12/29/2023    Expiration Date:   03/28/2024   CMP (Cancer Center only)    Standing Status:   Future    Expected Date:   12/29/2023    Expiration Date:   03/28/2024      CHIEF COMPLAINT:  CC: Iron deficiency anemia  Current Treatment: IV iron as needed  HISTORY OF PRESENT ILLNESS:  Marissa Black is a 78 y.o. female with iron deficiency who was referred in consultation by Dr. Lynnie Bring for assessment and management.  He saw her on April 11 for consideration of GI evaluation.  CBC revealed 11.9 with an MCV of 81, WBC 7.3 with a normal differential and platelets 282,000.  She had worsening iron was 32, TIBC 304 and iron saturation 6.5% and ferritin 7.5 despite oral iron supplement.   She was scheduled for CT abdomen and pelvis April 24, with plans for EGD and colonoscopy.    She saw Dr. Thurmond on December 30 for routine follow-up.  CBC without differential revealed WBCs 5.4, hemoglobin 8.5 with an MCV 71, and platelets 362,000.  CMP was unremarkable. She was called to come back for  additional testing, but to the emergency room if she experiences any bleeding.  She did end up going to to [facility] ER on January 2 with a conjunctival hemorrhage.  CBC revealed WBC 6.9, hemoglobin 9.3, MCV 78, platelets 389,000.  She had evidence of iron deficiency with serum iron 22, TIBC 466, saturation 4.7% and ferritin 5.9.  Stool Hemoccult was negative. She was started on ferrous sulfate daily.  She was seen again by Merlynn Push at Dr. Wyvonnia office on January 27.  CBC revealed hemoglobin 9.4 with an MCV of 71, WBCs 2.7, with 63% neutrophils, 38% lymphocytes, 8% monocytes, and 1% basophils, and platelets 277,000.  Iron studies were equivocal with iron 201, TIBC 442  and iron saturation 45%.  Ferritin was 14. Oral iron was continued.   Of note, she also has a history of B12 deficiency and continues B12 monthly at Dr. Wyvonnia office.  She also has a remote history of stage IIB hormone and HER2 receptor positive breast cancer treated with lumpectomy, adjuvant chemotherapy/HER2 targeted therapy and adjuvant radiation. She was placed on anastrozole in November 2008, but but stopped it on her own in March 2010 because of increasing arthralgias.  Due to her personal and family history of malignancy, she underwent genetic testing for hereditary cancer syndromes with the [employer] multi cancer panel test in March 2018.  This did not reveal any clinically significant mutations or variants of uncertain significance.  Her last mammogram was in August 2023 and did not reveal any evidence of malignancy.  Dr. Cornelius discharged her from clinic last year, as she follows closely with Dr. Thurmond and he had been ordering her mammograms.    The patient reported difficulty tolerating oral iron due to constipation.  She is on aspirin for atherosclerosis and stated Dr. Charlanne did not recommend holding her aspirin.  She received IV iron in the form of Feraheme  in April with a good response.   CT abdomen and pelvis in April did not reveal any acute findings.  Extensive colonic diverticulosis without diverticulitis and a tiny hiatal hernia were seen.  She states she canceled her EGD and colonoscopy as her daughter became very ill and still requires surgery.    INTERVAL HISTORY:   Marissa Black is here today for repeat clinical assessment.  She reports worsening fatigue concerning for recurrent anemia.  She reports pica to ice.   She denies any overt form of blood loss.  She denies fevers or chills. She denies pain. Her appetite is good. Her weight has been stable.  Bilateral diagnostic mammogram and right breast ultrasound on August 5 did not reveal any evidence of malignancy.  She still has not had EGD  and colonoscopy due to her daughter being ill.  REVIEW OF SYSTEMS:   Review of Systems  Constitutional:  Positive for fatigue. Negative for appetite change, chills, fever and unexpected weight change.  HENT:   Negative for lump/mass, mouth sores, nosebleeds and sore throat.   Respiratory:  Negative for cough, hemoptysis and shortness of breath.   Cardiovascular:  Negative for chest pain and leg swelling.  Gastrointestinal:  Negative for abdominal pain, blood in stool, constipation, diarrhea, nausea and vomiting.  Endocrine: Negative for hot flashes.  Genitourinary:  Negative for difficulty urinating, dysuria, frequency, hematuria and vaginal bleeding.   Musculoskeletal:  Negative for arthralgias, back pain and myalgias.  Skin:  Negative for rash.  Neurological:  Negative for dizziness and headaches.  Hematological:  Negative for adenopathy. Does not bruise/bleed easily.  Psychiatric/Behavioral:  Negative for depression and sleep disturbance. The patient is not nervous/anxious.      VITALS:   Blood pressure 139/64, pulse 71, temperature 98 F (36.7 C), temperature source Oral, resp. rate 18, height 5' 3.9 (1.623 m), weight 152 lb 9.6 oz (69.2 kg), SpO2 99%.  Wt Readings from Last 3 Encounters:  09/28/23 152 lb 9.6 oz (69.2 kg)  07/28/23 154 lb (69.9 kg)  05/26/23 154 lb 14.4 oz (70.3 kg)    Body mass index is 26.28 kg/m.  Performance status (ECOG): 1 - Symptomatic but completely ambulatory    PHYSICAL EXAM:   Physical Exam Vitals and nursing note reviewed.  Constitutional:      General: She is not in acute distress.    Appearance: Normal appearance.  HENT:     Head: Normocephalic and atraumatic.     Mouth/Throat:     Mouth: Mucous membranes are moist.     Pharynx: Oropharynx is clear. No oropharyngeal exudate or posterior oropharyngeal erythema.  Eyes:     General: No scleral icterus.    Extraocular Movements: Extraocular movements intact.     Conjunctiva/sclera:  Conjunctivae normal.     Pupils: Pupils are equal, round, and reactive to light.  Cardiovascular:     Rate and Rhythm: Normal rate and regular rhythm.     Heart sounds: Normal heart sounds. No murmur heard.    No friction rub. No gallop.  Pulmonary:     Effort: Pulmonary effort is normal.     Breath sounds: Normal breath sounds. No wheezing, rhonchi or rales.  Abdominal:     General: There is no distension.     Palpations: Abdomen is soft. There is no hepatomegaly, splenomegaly or mass.     Tenderness: There is no abdominal tenderness.  Musculoskeletal:        General: Normal range of motion.     Cervical back: Normal range of motion and neck supple. No tenderness.     Right lower leg: No edema.     Left lower leg: No edema.  Lymphadenopathy:     Cervical: No cervical adenopathy.     Upper Body:     Right upper body: No supraclavicular or axillary adenopathy.     Left upper body: No supraclavicular or axillary adenopathy.     Lower Body: No right inguinal adenopathy. No left inguinal adenopathy.  Skin:    General: Skin is warm and dry.     Coloration: Skin is not jaundiced.     Findings: No rash.  Neurological:     Mental Status: She is alert and oriented to person, place, and time.     Cranial Nerves: No cranial nerve deficit.  Psychiatric:        Mood and Affect: Mood normal.        Behavior: Behavior normal.        Thought Content: Thought content normal.     LABS:      Latest Ref Rng & Units 09/28/2023    9:35 AM 07/28/2023   10:57 AM 05/26/2023    8:51 AM  CBC  WBC 4.0 - 10.5 K/uL 4.3  6.0  5.4   Hemoglobin 12.0 - 15.0 g/dL 8.9  87.8  87.8   Hematocrit 36.0 - 46.0 % 28.6  37.8  37.4   Platelets 150 - 400 K/uL 298  268  299       Latest Ref Rng & Units 05/22/2023   11:28 AM 06/11/2005    1:44 PM  CMP  Glucose 70 - 99 mg/dL 899  894   BUN 6 - 23 mg/dL 16  14   Creatinine 9.59 - 1.20 mg/dL 9.14  1.0   Sodium 864 - 145 mEq/L 141  140   Potassium 3.5 - 5.1 mEq/L  4.4  3.7   Chloride 96 - 112 mEq/L 106  104   CO2 19 - 32 mEq/L 28  26   Calcium 8.4 - 10.5 mg/dL 9.3  9.0   Total Protein 6.0 - 8.3 g/dL 6.8  6.5   Total Bilirubin 0.2 - 1.2 mg/dL 0.4  0.4   Alkaline Phos 39 - 117 U/L 69  78   AST 0 - 37 U/L 17  24   ALT 0 - 35 U/L 11  19      Lab Results  Component Value Date   TOTALPROTELP 6.8 05/26/2023   ALBUMINELP 3.7 05/26/2023   A1GS 0.3 05/26/2023   A2GS 0.7 05/26/2023   BETS 1.1 05/26/2023   GAMS 1.1 05/26/2023   MSPIKE Not Observed 05/26/2023   Lab Results  Component Value Date   TIBC 462 (H) 09/28/2023   TIBC 358 07/28/2023   TIBC 425.6 05/22/2023   FERRITIN 4 (L) 09/28/2023   FERRITIN 25 07/28/2023   FERRITIN 6.5 (L) 05/22/2023   IRONPCTSAT 4 (L) 09/28/2023   IRONPCTSAT 16 07/28/2023   IRONPCTSAT 7.5 (L) 05/22/2023   Lab Results  Component Value Date   LDH 173 06/11/2005    STUDIES:   MM DIAG BREAST TOMO BILATERAL Result Date: 09/15/2023 CLINICAL DATA:  78 year old female presents with OUTER RIGHT breast pain and for annual bilateral mammogram. History of RIGHT breast cancer and lumpectomies in 1997 in 2007. EXAM: DIGITAL DIAGNOSTIC BILATERAL MAMMOGRAM WITH TOMOSYNTHESIS AND CAD; ULTRASOUND RIGHT BREAST LIMITED TECHNIQUE: Bilateral digital diagnostic mammography and breast tomosynthesis was performed. The images were evaluated with computer-aided detection. ; Targeted ultrasound examination of the right breast was performed COMPARISON:  Previous exam(s). ACR Breast Density Category c: The breasts are heterogeneously dense, which may obscure small masses. FINDINGS: Full field views of both breasts and spot compression view of the LEFT breast demonstrate no new or suspicious mammographic findings. Surgical changes within the RIGHT breast again noted. Targeted ultrasound is performed, showing no suspicious mass, nonsurgical distortion or worrisome shadowing within the RIGHT breast, in the areas of patient's pain. IMPRESSION: 1. No  suspicious mammographic or sonographic abnormalities within the RIGHT breast in the area of patient's pain. 2. No new or suspicious mammographic findings within either breast. 3. RIGHT surgical changes. RECOMMENDATION: Recommend clinical follow-up as indicated. Any further workup should be based on clinical grounds. Bilateral screening mammogram in 1 year. I have discussed the findings and recommendations with the patient. If applicable, a reminder letter will be sent to the patient regarding the next appointment. BI-RADS CATEGORY  2: Benign. Electronically Signed   By: Reyes Phi M.D.   On: 09/15/2023 11:07   US  LIMITED ULTRASOUND INCLUDING AXILLA RIGHT BREAST Result Date: 09/15/2023 CLINICAL DATA:  78 year old female presents with OUTER RIGHT breast pain and for annual bilateral mammogram. History of RIGHT breast cancer and lumpectomies in 1997 in 2007. EXAM: DIGITAL DIAGNOSTIC BILATERAL MAMMOGRAM WITH TOMOSYNTHESIS AND CAD; ULTRASOUND RIGHT BREAST LIMITED TECHNIQUE: Bilateral digital diagnostic mammography and breast tomosynthesis was performed. The images were evaluated with computer-aided detection. ; Targeted ultrasound examination of the right breast was performed COMPARISON:  Previous exam(s). ACR Breast Density Category c: The breasts are heterogeneously dense, which may obscure small  masses. FINDINGS: Full field views of both breasts and spot compression view of the LEFT breast demonstrate no new or suspicious mammographic findings. Surgical changes within the RIGHT breast again noted. Targeted ultrasound is performed, showing no suspicious mass, nonsurgical distortion or worrisome shadowing within the RIGHT breast, in the areas of patient's pain. IMPRESSION: 1. No suspicious mammographic or sonographic abnormalities within the RIGHT breast in the area of patient's pain. 2. No new or suspicious mammographic findings within either breast. 3. RIGHT surgical changes. RECOMMENDATION: Recommend clinical  follow-up as indicated. Any further workup should be based on clinical grounds. Bilateral screening mammogram in 1 year. I have discussed the findings and recommendations with the patient. If applicable, a reminder letter will be sent to the patient regarding the next appointment. BI-RADS CATEGORY  2: Benign. Electronically Signed   By: Reyes Phi M.D.   On: 09/15/2023 11:07      HISTORY:   Past Medical History:  Diagnosis Date   Age-related osteoporosis without current pathological fracture    Anemia    Atherosclerosis of vertebral artery    Cervical spinal stenosis    Degenerative cervical disc    Essential hypertension    Family history of breast cancer    Family history of kidney cancer    Family history of stomach cancer    GERD (gastroesophageal reflux disease)    History of cardiac arrhythmia    History of hypothyroidism    Kidney cyst, acquired    Personal history of breast cancer    Personal history of chemotherapy    Personal history of radiation therapy    Recurrent UTI    RLS (restless legs syndrome)    S/p left hip fracture    Tinnitus    Vitamin B12 deficiency     Past Surgical History:  Procedure Laterality Date   BREAST LUMPECTOMY Right 2007   x2   CHOLECYSTECTOMY     left hip fracture Left    PARTIAL HYSTERECTOMY      Family History  Problem Relation Age of Onset   Kidney cancer Mother        dx in her 41s   Congestive Heart Failure Father    Lung cancer Brother        smoker   Stomach cancer Maternal Grandmother        dx in her 62s   Cirrhosis Maternal Aunt    Cirrhosis Maternal Uncle    Cirrhosis Maternal Uncle    Breast cancer Cousin 25       maternal first cousin   Breast cancer Cousin        dx in her 40s; maternal first cousin   Breast cancer Cousin        dx in her 43s, maternal first cousin   Liver disease Neg Hx    Esophageal cancer Neg Hx    Colon cancer Neg Hx     Social History:  reports that she has never smoked. She has  never used smokeless tobacco. She reports that she does not drink alcohol and does not use drugs.The patient is alone today.  Allergies:  Allergies  Allergen Reactions   Codeine Itching    Current Medications: Current Outpatient Medications  Medication Sig Dispense Refill   amLODipine (NORVASC) 5 MG tablet Take by mouth.     aspirin EC 81 MG tablet Take by mouth.     cyanocobalamin  (VITAMIN B12) 1000 MCG/ML injection Inject into the muscle.     ferrous sulfate  325 (65 FE) MG tablet Take 325 mg by mouth daily with breakfast. (Patient not taking: Reported on 05/26/2023)     fluticasone  (FLONASE ) 50 MCG/ACT nasal spray Place 1 spray into both nostrils daily.     pantoprazole (PROTONIX) 40 MG tablet Take 40 mg by mouth daily.     rOPINIRole (REQUIP) 0.5 MG tablet Take 0.5 mg by mouth at bedtime. (Patient not taking: Reported on 05/26/2023)     No current facility-administered medications for this visit.

## 2023-09-28 NOTE — Assessment & Plan Note (Signed)
 Remote history of stage IIB hormone receptor positive breast cancer diagnosed in March 2007. She treated with lumpectomy, adjuvant radiation and partial adjuvant endocrine therapy.  Bilateral diagnostic mammogram and right breast ultrasound this month did not reveal any evidence of malignancy.  Will plan for bilateral screening mammogram in 1 year.

## 2023-09-28 NOTE — Assessment & Plan Note (Signed)
 Iron deficiency anemia diagnosed in December 2024, felt to be most likely due to chronic GI blood loss.  The patient reported difficulties tolerating oral iron mainly due to constipation.  She received IV iron in the form of Feraheme  in April with a good response.  Unfortunately, she did not get her EGD and colonoscopy done.  I encouraged her to do so as soon as possible.  She now has evidence of recurrent iron deficiency.  I will arrange for her to receive IV Feraheme  again at this time.  I will plan to see her back in 3 months with a CBC, iron/TIBC, ferritin

## 2023-09-30 ENCOUNTER — Inpatient Hospital Stay

## 2023-09-30 VITALS — BP 123/72 | HR 72 | Temp 97.7°F | Resp 18 | Wt 154.0 lb

## 2023-09-30 DIAGNOSIS — D509 Iron deficiency anemia, unspecified: Secondary | ICD-10-CM

## 2023-09-30 MED ORDER — SODIUM CHLORIDE 0.9 % IV SOLN
510.0000 mg | Freq: Once | INTRAVENOUS | Status: AC
  Administered 2023-09-30: 510 mg via INTRAVENOUS
  Filled 2023-09-30: qty 510

## 2023-09-30 MED ORDER — LORATADINE 10 MG PO TABS
10.0000 mg | ORAL_TABLET | Freq: Once | ORAL | Status: AC
  Administered 2023-09-30: 10 mg via ORAL
  Filled 2023-09-30: qty 1

## 2023-09-30 MED ORDER — ACETAMINOPHEN 325 MG PO TABS
650.0000 mg | ORAL_TABLET | Freq: Once | ORAL | Status: AC
  Administered 2023-09-30: 650 mg via ORAL
  Filled 2023-09-30: qty 2

## 2023-09-30 MED ORDER — SODIUM CHLORIDE 0.9 % IV SOLN: INTRAVENOUS | Status: DC

## 2023-09-30 NOTE — Patient Instructions (Signed)

## 2023-10-05 ENCOUNTER — Inpatient Hospital Stay

## 2023-10-05 VITALS — BP 134/59 | HR 66 | Temp 97.9°F | Resp 20

## 2023-10-05 DIAGNOSIS — D509 Iron deficiency anemia, unspecified: Secondary | ICD-10-CM

## 2023-10-05 MED ORDER — ACETAMINOPHEN 325 MG PO TABS
650.0000 mg | ORAL_TABLET | Freq: Once | ORAL | Status: AC
  Administered 2023-10-05: 650 mg via ORAL
  Filled 2023-10-05: qty 2

## 2023-10-05 MED ORDER — SODIUM CHLORIDE 0.9 % IV SOLN
510.0000 mg | Freq: Once | INTRAVENOUS | Status: AC
  Administered 2023-10-05: 510 mg via INTRAVENOUS
  Filled 2023-10-05: qty 510

## 2023-10-05 MED ORDER — SODIUM CHLORIDE 0.9 % IV SOLN
INTRAVENOUS | Status: DC
Start: 2023-10-05 — End: 2023-10-05

## 2023-10-05 MED ORDER — LORATADINE 10 MG PO TABS
10.0000 mg | ORAL_TABLET | Freq: Once | ORAL | Status: AC
  Administered 2023-10-05: 10 mg via ORAL
  Filled 2023-10-05: qty 1

## 2023-10-05 NOTE — Patient Instructions (Signed)

## 2023-11-23 ENCOUNTER — Encounter

## 2023-11-25 ENCOUNTER — Other Ambulatory Visit: Payer: Self-pay

## 2023-11-25 ENCOUNTER — Encounter: Payer: Self-pay | Admitting: Hematology and Oncology

## 2023-11-25 ENCOUNTER — Ambulatory Visit

## 2023-11-25 ENCOUNTER — Encounter: Payer: Self-pay | Admitting: Gastroenterology

## 2023-11-25 VITALS — Ht 64.0 in | Wt 148.0 lb

## 2023-11-25 DIAGNOSIS — K5900 Constipation, unspecified: Secondary | ICD-10-CM

## 2023-11-25 MED ORDER — NA SULFATE-K SULFATE-MG SULF 17.5-3.13-1.6 GM/177ML PO SOLN: 1.0000 | Freq: Once | ORAL | 0 refills | Status: AC

## 2023-11-25 NOTE — Progress Notes (Signed)
 Denies allergies to eggs or soy products. Denies complication of anesthesia or sedation. Denies use of weight loss medication. Denies use of O2.   Emmi instructions given for colonoscopy.

## 2023-12-03 ENCOUNTER — Encounter: Payer: Self-pay | Admitting: Gastroenterology

## 2023-12-03 ENCOUNTER — Ambulatory Visit: Admitting: Gastroenterology

## 2023-12-03 ENCOUNTER — Other Ambulatory Visit (INDEPENDENT_AMBULATORY_CARE_PROVIDER_SITE_OTHER)

## 2023-12-03 ENCOUNTER — Other Ambulatory Visit: Payer: Self-pay

## 2023-12-03 VITALS — BP 133/57 | HR 68 | Temp 98.2°F | Resp 15 | Ht 64.0 in | Wt 148.0 lb

## 2023-12-03 DIAGNOSIS — K640 First degree hemorrhoids: Secondary | ICD-10-CM | POA: Diagnosis not present

## 2023-12-03 DIAGNOSIS — K6389 Other specified diseases of intestine: Secondary | ICD-10-CM | POA: Diagnosis not present

## 2023-12-03 DIAGNOSIS — K5900 Constipation, unspecified: Secondary | ICD-10-CM

## 2023-12-03 DIAGNOSIS — K449 Diaphragmatic hernia without obstruction or gangrene: Secondary | ICD-10-CM

## 2023-12-03 DIAGNOSIS — K5730 Diverticulosis of large intestine without perforation or abscess without bleeding: Secondary | ICD-10-CM | POA: Diagnosis not present

## 2023-12-03 DIAGNOSIS — R1032 Left lower quadrant pain: Secondary | ICD-10-CM

## 2023-12-03 DIAGNOSIS — K219 Gastro-esophageal reflux disease without esophagitis: Secondary | ICD-10-CM | POA: Diagnosis not present

## 2023-12-03 DIAGNOSIS — K317 Polyp of stomach and duodenum: Secondary | ICD-10-CM

## 2023-12-03 DIAGNOSIS — D509 Iron deficiency anemia, unspecified: Secondary | ICD-10-CM

## 2023-12-03 DIAGNOSIS — C182 Malignant neoplasm of ascending colon: Secondary | ICD-10-CM

## 2023-12-03 LAB — COMPREHENSIVE METABOLIC PANEL WITH GFR
ALT: 11 U/L (ref 0–35)
AST: 17 U/L (ref 0–37)
Albumin: 4.3 g/dL (ref 3.5–5.2)
Alkaline Phosphatase: 72 U/L (ref 39–117)
BUN: 18 mg/dL (ref 6–23)
CO2: 24 meq/L (ref 19–32)
Calcium: 9.2 mg/dL (ref 8.4–10.5)
Chloride: 108 meq/L (ref 96–112)
Creatinine, Ser: 0.77 mg/dL (ref 0.40–1.20)
GFR: 74.21 mL/min (ref >60.00)
Glucose, Bld: 85 mg/dL (ref 70–99)
Potassium: 4.3 meq/L (ref 3.5–5.1)
Sodium: 142 meq/L (ref 135–145)
Total Bilirubin: 0.5 mg/dL (ref 0.2–1.2)
Total Protein: 7.4 g/dL (ref 6.0–8.3)

## 2023-12-03 LAB — CBC WITH DIFFERENTIAL/PLATELET
Basophils Absolute: 0.1 K/uL (ref 0.0–0.1)
Basophils Relative: 1.5 % (ref 0.0–3.0)
Eosinophils Absolute: 0 K/uL (ref 0.0–0.7)
Eosinophils Relative: 0.6 % (ref 0.0–5.0)
HCT: 35.4 % — ABNORMAL LOW (ref 36.0–46.0)
Hemoglobin: 11.3 g/dL — ABNORMAL LOW (ref 12.0–15.0)
Lymphocytes Relative: 20.8 % (ref 12.0–46.0)
Lymphs Abs: 1.1 K/uL (ref 0.7–4.0)
MCHC: 31.8 g/dL (ref 30.0–36.0)
MCV: 80.3 fl (ref 78.0–100.0)
Monocytes Absolute: 0.2 K/uL (ref 0.1–1.0)
Monocytes Relative: 4.5 % (ref 3.0–12.0)
Neutro Abs: 3.7 K/uL (ref 1.4–7.7)
Neutrophils Relative %: 72.6 % (ref 43.0–77.0)
Platelets: 308 K/uL (ref 150.0–400.0)
RBC: 4.41 Mil/uL (ref 3.87–5.11)
RDW: 16.3 % — ABNORMAL HIGH (ref 11.5–15.5)
WBC: 5.1 K/uL (ref 4.0–10.5)

## 2023-12-03 MED ORDER — SODIUM CHLORIDE 0.9 % IV SOLN: 500.0000 mL | Freq: Once | INTRAVENOUS | Status: DC

## 2023-12-03 NOTE — Progress Notes (Signed)
 Called to room to assist during endoscopic procedure.  Patient ID and intended procedure confirmed with present staff. Received instructions for my participation in the procedure from the performing physician.

## 2023-12-03 NOTE — Progress Notes (Signed)
 Pt's states no medical or surgical changes since previsit or office visit.

## 2023-12-03 NOTE — Op Note (Signed)
 [facility] Patient Name: Marissa Black Procedure Date: 12/03/2023 10:10 AM MRN: [id number] Endoscopist: Lynnie Bring , MD, [id number] Age: 78 Referring MD:  Date of Birth: 26-Jul-1945 Gender: Female Account #: [id number] Procedure:                Upper GI endoscopy Indications:              Iron deficiency anemia, Heartburn Medicines:                Monitored Anesthesia Care Procedure:                Pre-Anesthesia Assessment:                           - Prior to the procedure, a History and Physical                            was performed, and patient medications and                            allergies were reviewed. The patient's tolerance of                            previous anesthesia was also reviewed. The risks                            and benefits of the procedure and the sedation                            options and risks were discussed with the patient.                            All questions were answered, and informed consent                            was obtained. Prior Anticoagulants: The patient has                            taken no anticoagulant or antiplatelet agents. ASA                            Grade Assessment: II - A patient with mild systemic                            disease. After reviewing the risks and benefits,                            the patient was deemed in satisfactory condition to                            undergo the procedure.                           After obtaining informed consent, the endoscope was  passed under direct vision. Throughout the                            procedure, the patient's blood pressure, pulse, and                            oxygen saturations were monitored continuously. The                            Olympus Scope [id number] was introduced through the                            mouth, and advanced to the second part of duodenum.                            The upper GI  endoscopy was accomplished without                            difficulty. The patient tolerated the procedure                            well. Scope In: Scope Out: Findings:                 The examined esophagus was normal with well-defined                            Z-line at 35 cm, examined by NBI.                           A small hiatal hernia was present.                           Multiple 4 to 5 mm sessile polyps with no bleeding                            and no stigmata of recent bleeding were found in                            the gastric body. Three polyps were removed with a                            cold biopsy forceps. Resection and retrieval were                            complete.                           The examined duodenum was normal. Biopsies for                            histology were taken with a cold forceps for                            evaluation of  celiac disease. Complications:            No immediate complications. Estimated Blood Loss:     Estimated blood loss: none. Impression:               - Small hiatal hernia.                           - Multiple gastric polyps. Resected and retrieved x                            3.                           - Normal examined duodenum. Biopsied. Recommendation:           - Patient has a contact number available for                            emergencies. The signs and symptoms of potential                            delayed complications were discussed with the                            patient. Return to normal activities tomorrow.                            Written discharge instructions were provided to the                            patient.                           - Resume previous diet.                           - Continue present medications.                           - Await pathology results.                           - Proceed with colonoscopy.                           - The findings and  recommendations were discussed                            with the patient's family. Lynnie Bring, MD 12/03/2023 11:02:25 AM This report has been signed electronically.

## 2023-12-03 NOTE — Progress Notes (Signed)
 Chief Complaint:  new onset IDA  Referring Provider:  Thurmond Cathlyn LABOR., MD      ASSESSMENT AND PLAN;   #1. IDA with heme neg stools (Hb 25 July 2022 to 9.4 02/2023), neg SPEP  #2. GERD  #3. Constipation with LLQ pain  #4. H/O breast Ca   Plan: -EGD/colon with miralax -protonix 20mg  po every day to continue -CT AP with contrast -CBC, CMP, iron studies -FU appt with Dr Cornelius. Due for FU.    I discussed EGD/Colonoscopy- the indications, risks, alternatives and potential complications including, but not limited to bleeding, infection, reaction to meds, damage to internal organs, cardiac and/or pulmonary problems, and perforation requiring surgery. The possibility that significant findings could be missed was explained. All ? were answered. Pt consents to proceed.  CT- 05/2023 IMPRESSION: No acute findings. Extensive colonic diverticulosis, without radiographic evidence of diverticulitis. No evidence of metastatic disease. Tiny hiatal hernia. HPI:    Marissa Black is a 78 y.o. female  With history of HTN, osteoporosis, GERD, B12 deficiency, breast CA, s/p cholecystectomy, hysterectomy History of Present Illness Marissa Black is a 78 year old female with iron deficiency anemia who presents with low hemoglobin levels. She was referred by Merlynn for evaluation of low hemoglobin levels.  Low hemoglobin levels were noted to be 9.4 in Jan 2025 (Was 14.16 July 2022), identified during a hospital visit on December 30th. Her mean corpuscular volume (MCV) was recorded at 71. Stool tests were negative for blood. She has been taking iron tablets but stopped in the last week or two due to exacerbation of constipation.  She experiences heartburn with varying severity. Initially on omeprazole, she switched to pantoprazole in January, which has provided some improvement, though heartburn persists. No dysphagia, nausea, or vomiting, but bending over can cause  reflux. No abdominal pain.  She experiences constipation, attributed to iron supplements, with bowel movements typically once a day. No diarrhea.  Rare LLQ discomfort.  Her past medical history includes breast cancer, diagnosed in 2007 as stage 2B, and osteoporosis. She takes B12 supplements and a baby aspirin regularly. No recent weight loss.  No sodas, chocolates, chewing gums, artificial sweeteners and candy. No NSAIDs  Past GI workup: Colonoscopy by Dr. Towana 2008-negative  Results LABS Hb: 9.4 (02/09/2023) MCV: 71 (02/09/2023) Fecal occult blood test: Negative (02/09/2023)    Past Medical History:  Diagnosis Date   Age-related osteoporosis without current pathological fracture    Allergy    Anemia    Arthritis    Atherosclerosis of vertebral artery    Cancer (HCC)    Cervical spinal stenosis    Degenerative cervical disc    Essential hypertension    Family history of breast cancer    Family history of kidney cancer    Family history of stomach cancer    GERD (gastroesophageal reflux disease)    History of cardiac arrhythmia    History of hypothyroidism    Kidney cyst, acquired    Personal history of breast cancer    Personal history of chemotherapy    Personal history of radiation therapy    Recurrent UTI    RLS (restless legs syndrome)    S/p left hip fracture    Tinnitus    Vitamin B12 deficiency     Past Surgical History:  Procedure Laterality Date   BREAST LUMPECTOMY Right 2007   x2   CHOLECYSTECTOMY     left hip fracture Left    PARTIAL  HYSTERECTOMY      Family History  Problem Relation Age of Onset   Kidney cancer Mother        dx in her 91s   Congestive Heart Failure Father    Lung cancer Brother        smoker   Cirrhosis Maternal Aunt    Cirrhosis Maternal Uncle    Cirrhosis Maternal Uncle    Stomach cancer Maternal Grandmother        dx in her 35s   Breast cancer Cousin 25       maternal first cousin   Breast cancer Cousin         dx in her 65s; maternal first cousin   Breast cancer Cousin        dx in her 54s, maternal first cousin   Liver disease Neg Hx    Esophageal cancer Neg Hx    Colon cancer Neg Hx    Rectal cancer Neg Hx     Social History   Tobacco Use   Smoking status: Never   Smokeless tobacco: Never  Vaping Use   Vaping status: Never Used  Substance Use Topics   Alcohol use: No   Drug use: No    Current Outpatient Medications  Medication Sig Dispense Refill   amLODipine (NORVASC) 5 MG tablet Take by mouth.     aspirin EC 81 MG tablet Take by mouth.     cyanocobalamin  (VITAMIN B12) 1000 MCG/ML injection Inject into the muscle.     ferrous sulfate 325 (65 FE) MG tablet Take 325 mg by mouth daily with breakfast. (Patient not taking: Reported on 11/25/2023)     fluticasone  (FLONASE ) 50 MCG/ACT nasal spray Place 1 spray into both nostrils daily.     pantoprazole (PROTONIX) 40 MG tablet Take 40 mg by mouth daily.     rOPINIRole (REQUIP) 0.5 MG tablet Take 0.5 mg by mouth at bedtime. (Patient not taking: Reported on 11/25/2023)     No current facility-administered medications for this visit.    Allergies  Allergen Reactions   Codeine Itching    Review of Systems:  Constitutional: Denies fever, chills, diaphoresis, appetite change and fhas atigue.  HEENT: Had subconjunctival hemorrhage-resolved. Respiratory: Denies SOB, DOE, cough, chest tightness,  and wheezing.   Cardiovascular: Denies chest pain, palpitations and leg swelling.  Genitourinary: Denies dysuria, urgency, frequency, hematuria, flank pain and difficulty urinating.  Musculoskeletal: Has osteoarthritis.  Skin: No rash.  Neurological: Denies dizziness, seizures, syncope, weakness, light-headedness, numbness and headaches.  Hematological: Denies adenopathy. Easy bruising, personal or family bleeding history  Psychiatric/Behavioral: has anxiety or depression     Physical Exam:    BP (!) 157/86   Temp 98.2 F (36.8 C) (Other  (Comment)) Comment (Src): forehead  Ht 5' 4 (1.626 m)   Wt 148 lb (67.1 kg)   SpO2 99%   BMI 25.40 kg/m  Wt Readings from Last 3 Encounters:  12/03/23 148 lb (67.1 kg)  11/25/23 148 lb (67.1 kg)  09/30/23 154 lb (69.9 kg)   Constitutional:  Well-developed, in no acute distress. Psychiatric: Normal mood and affect. Behavior is normal. HEENT: Pupils normal.  Conjunctivae are normal. No scleral icterus. Neck supple.  Cardiovascular: Normal rate, regular rhythm. No edema Pulmonary/chest: Effort normal and breath sounds normal. No wheezing, rales or rhonchi. Abdominal: Soft, nondistended. Nontender. Bowel sounds active throughout. There are no masses palpable. No hepatomegaly. Rectal: Deferred Neurological: Alert and oriented to person place and time. Skin: Skin is warm and dry.  No rashes noted.  Data Reviewed: I have personally reviewed following labs and imaging studies  See above    Contains abnormal data CBC with Differential on 02/09/2023 Order: [id number] Component Ref Range & Units 2 mo ago  WBC 4.40 - 11.00 10*3/uL 2.7 Low   RBC 4.10 - 5.10 10*6/uL 4.25  Hemoglobin 12.3 - 15.3 g/dL 9.4 Low   Hematocrit 64.0 - 44.6 % 30.3 Low   Mean Corpuscular Volume (MCV) 80.0 - 96.0 fL 71.2 Low   Mean Corpuscular Hemoglobin (MCH) 27.5 - 33.2 pg 22.2 Low   Mean Corpuscular Hemoglobin Conc (MCHC) 33.0 - 37.0 g/dL 68.7 Low   Red Cell Distribution Width (RDW) 12.3 - 17.0 % 20.1 High   Platelet Count (PLT) 150 - 450 10*3/uL 277    Normal CMP with BUN 18 creatinine 0.83 on 02/09/2023    Anselm Bring, MD 12/03/2023, 10:11 AM  Cc: Thurmond Cathlyn LABOR., MD

## 2023-12-03 NOTE — Op Note (Signed)
 [facility] Patient Name: Marissa Black Procedure Date: 12/03/2023 10:04 AM MRN: [id number] Endoscopist: Lynnie Bring , MD, [id number] Age: 78 Referring MD:  Date of Birth: [birth year] Gender: Female Account #: [id number] Procedure:                Colonoscopy Indications:              Iron deficiency anemia with negative CT Abdo/pelvis                            05/2023 Medicines:                Monitored Anesthesia Care Procedure:                Pre-Anesthesia Assessment:                           - Prior to the procedure, a History and Physical                            was performed, and patient medications and                            allergies were reviewed. The patient's tolerance of                            previous anesthesia was also reviewed. The risks                            and benefits of the procedure and the sedation                            options and risks were discussed with the patient.                            All questions were answered, and informed consent                            was obtained. Prior Anticoagulants: The patient has                            taken no anticoagulant or antiplatelet agents. ASA                            Grade Assessment: II - A patient with mild systemic                            disease. After reviewing the risks and benefits,                            the patient was deemed in satisfactory condition to                            undergo the procedure.  After obtaining informed consent, the colonoscope                            was passed under direct vision. Throughout the                            procedure, the patient's blood pressure, pulse, and                            oxygen saturations were monitored continuously. The                            CF [id number] #[id number] was introduced through the anus                            and advanced to the the cecum, identified by                             appendiceal orifice and ileocecal valve. The                            colonoscopy was performed without difficulty. The                            patient tolerated the procedure well. The quality                            of the bowel preparation was good. The ileocecal                            valve, appendiceal orifice, and rectum were                            photographed. Scope In: 10:45:37 AM Scope Out: 10:58:52 AM Scope Withdrawal Time: 0 hours 9 minutes 1 second  Total Procedure Duration: 0 hours 13 minutes 15 seconds  Findings:                 An 3 cm infiltrative, polypoid, sessile and                            ulcerated non-obstructing medium-sized mass was                            found in the proximal ascending colon/distal cecum,                            opposite ileocecal valve. The mass one-half of the                            lumen circumference and was nonobstructing. This                            did not involve the ileocecal valve or the terminal  ileum. This was biopsied with a cold forceps for                            histology. Area was tattooed with an injection of 2                            mL of [place] ink. A 6 mm polyp was noted just distal                            to the mass which was not removed.                           Multiple small-mouthed diverticula were found in                            the sigmoid colon, descending colon, few in                            transverse colon and ascending colon.                           Non-bleeding internal hemorrhoids were found during                            retroflexion. The hemorrhoids were small and Grade                            I (internal hemorrhoids that do not prolapse).                           The exam was otherwise without abnormality on                            direct and retroflexion views. Complications:            No  immediate complications. Estimated Blood Loss:     Estimated blood loss: none. Impression:               - Likely malignant tumor in the proximal ascending                            colon/distal cecum opposite ileocecal valve.                            Biopsied. Tattooed.                           - Moderate pancolonic diverticulosis.                           - Non-bleeding internal hemorrhoids.                           - The examination was otherwise normal on direct  and retroflexion views. Recommendation:           - Patient has a contact number available for                            emergencies. The signs and symptoms of potential                            delayed complications were discussed with the                            patient. Return to normal activities tomorrow.                            Written discharge instructions were provided to the                            patient.                           - Resume previous diet.                           - Continue present medications.                           - Await pathology results. Biopsies have been sent                            [place].                           - Check CBC, CMP, CEA level today.                           - Once the biopsies are back, she will likely need                            a repeat CT chest Abdo/pelvis. Note that she                            follows up with Dr. Cornelius. Likely would need                            surgical evaluation.                           - The findings and recommendations were discussed                            with the patient's family. Lynnie Bring, MD 12/03/2023 11:11:03 AM This report has been signed electronically.

## 2023-12-03 NOTE — Progress Notes (Signed)
 Report to PACU, RN, vss, BBS= Clear.

## 2023-12-03 NOTE — Patient Instructions (Signed)
 Thank you for letting us  take care of your healthcare needs today. Please see handouts given to you on Diverticulosis, Hemorrhoids and Hiatal Hernia. Will obtain labs today and wait for pathology results to come back.  Dr. Charlanne will be in contact with you as soon as possible.     YOU HAD AN ENDOSCOPIC PROCEDURE TODAY AT THE [facility]:   Refer to the procedure report that was given to you for any specific questions about what was found during the examination.  If the procedure report does not answer your questions, please call your gastroenterologist to clarify.  If you requested that your care partner not be given the details of your procedure findings, then the procedure report has been included in a sealed envelope for you to review at your convenience later.  YOU SHOULD EXPECT: Some feelings of bloating in the abdomen. Passage of more gas than usual.  Walking can help get rid of the air that was put into your GI tract during the procedure and reduce the bloating. If you had a lower endoscopy (such as a colonoscopy or flexible sigmoidoscopy) you may notice spotting of blood in your stool or on the toilet paper. If you underwent a bowel prep for your procedure, you may not have a normal bowel movement for a few days.  Please Note:  You might notice some irritation and congestion in your nose or some drainage.  This is from the oxygen used during your procedure.  There is no need for concern and it should clear up in a day or so.  SYMPTOMS TO REPORT IMMEDIATELY:  Following lower endoscopy (colonoscopy or flexible sigmoidoscopy):  Excessive amounts of blood in the stool  Significant tenderness or worsening of abdominal pains  Swelling of the abdomen that is new, acute  Fever of 100F or higher  Following upper endoscopy (EGD)  Vomiting of blood or coffee ground material  New chest pain or pain under the shoulder blades  Painful or persistently difficult swallowing  New  shortness of breath  Fever of 100F or higher  Black, tarry-looking stools  For urgent or emergent issues, a gastroenterologist can be reached at any hour by calling [phone]. Do not use MyChart messaging for urgent concerns.    DIET:  We do recommend a small meal at first, but then you may proceed to your regular diet.  Drink plenty of fluids but you should avoid alcoholic beverages for 24 hours.  ACTIVITY:  You should plan to take it easy for the rest of today and you should NOT DRIVE or use heavy machinery until tomorrow (because of the sedation medicines used during the test).    FOLLOW UP: Our staff will call the number listed on your records the next business day following your procedure.  We will call around 7:15- 8:00 am to check on you and address any questions or concerns that you may have regarding the information given to you following your procedure. If we do not reach you, we will leave a message.     If any biopsies were taken you will be contacted by phone or by letter within the next 1-3 weeks.  Please call us  at [phone] if you have not heard about the biopsies in 3 weeks.    SIGNATURES/CONFIDENTIALITY: You and/or your care partner have signed paperwork which will be entered into your electronic medical record.  These signatures attest to the fact that that the information above on your After Visit Summary  has been reviewed and is understood.  Full responsibility of the confidentiality of this discharge information lies with you and/or your care-partner.

## 2023-12-04 ENCOUNTER — Telehealth: Payer: Self-pay

## 2023-12-04 ENCOUNTER — Telehealth: Payer: Self-pay | Admitting: Gastroenterology

## 2023-12-04 ENCOUNTER — Other Ambulatory Visit: Payer: Self-pay

## 2023-12-04 DIAGNOSIS — C189 Malignant neoplasm of colon, unspecified: Secondary | ICD-10-CM

## 2023-12-04 DIAGNOSIS — K6389 Other specified diseases of intestine: Secondary | ICD-10-CM

## 2023-12-04 LAB — SURGICAL PATHOLOGY

## 2023-12-04 LAB — CEA: CEA: 4.6 ng/mL — ABNORMAL HIGH

## 2023-12-04 NOTE — Telephone Encounter (Signed)
   Discussed biopsy results from colonoscopy yesterday with daughter in detail Bx-adenocarcinoma involving proximal ascending colon  hemoglobin 11.6.  CEA pending.  Patient did have CT Abdo/pelvis in April which was negative for any masses.  However, she will need staging.  Plan: - Proceed with CT Abdo/pelvis/chest with contrast for staging. - FU appointment with Dr. Medford Cornish [facility] cancer Center) New Dx of colon cancer - Appoint with Dr. Medford Pizza [facility] surgery) at her daughter's request.  - Please send above to Dr. Thurmond [facility]

## 2023-12-04 NOTE — Telephone Encounter (Signed)
  Follow up Call-     12/03/2023   10:11 AM  Call back number  Post procedure Call Back phone  # [phone]  Permission to leave phone message Yes     Patient questions:  Do you have a fever, pain , or abdominal swelling? No. Pain Score  0 *  Have you tolerated food without any problems? Yes.    Have you been able to return to your normal activities? Yes.    Do you have any questions about your discharge instructions: Diet   No. Medications  No. Follow up visit  No.  Do you have questions or concerns about your Care? No.  Actions: * If pain score is 4 or above: No action needed, pain <4.

## 2023-12-04 NOTE — Telephone Encounter (Signed)
 CT scheduled for 10/30 at 3:30 pm arrive by 1:15 pm at [facility]. Referral faxed to [facility] & request for follow up faxed to Dr. Yuvonne office, and PCP faxed copy of notes. Daughter has been made aware. She has also been provided radiology scheduling number for rescheduling purposes if needed. Also, advised she call us  if she has not heard from [facility] or cancer center in 2 weeks.

## 2023-12-05 ENCOUNTER — Ambulatory Visit: Payer: Self-pay | Admitting: Gastroenterology

## 2023-12-05 NOTE — Progress Notes (Signed)
 Bx-new diagnosis of ascending colon adenocarcinoma.    I have discussed biopsy results with the patient's daughter in detail. She is scheduled to have CT chest/Abdo/pelvis for staging. She also follow appointment Dr. Cornelius and Dr. Teresa Send report to Dr Thurmond

## 2023-12-08 ENCOUNTER — Encounter: Payer: Self-pay | Admitting: Oncology

## 2023-12-08 ENCOUNTER — Other Ambulatory Visit: Payer: Self-pay | Admitting: Oncology

## 2023-12-08 DIAGNOSIS — C182 Malignant neoplasm of ascending colon: Secondary | ICD-10-CM | POA: Insufficient documentation

## 2023-12-09 ENCOUNTER — Telehealth: Payer: Self-pay | Admitting: Oncology

## 2023-12-09 NOTE — Telephone Encounter (Signed)
 Patient has been scheduled. Aware of appt date and time.    Scheduling Message Entered by CORNELIUS WANDA DEL on 12/08/2023 at  6:07 PM Priority: High <No visit type provided>  Department: [facility]  Provider:  Scheduling Notes:  She has an appt in a few weeks but now diagnosed with colon cancer and Dr. Charlanne has ordered CT scans. We need to move her appt sooner, 11/4? Or 1 opening on 11/6.  Other options would be to see after surgery if surgeon plans to do soon

## 2023-12-10 ENCOUNTER — Other Ambulatory Visit (HOSPITAL_COMMUNITY)

## 2023-12-11 ENCOUNTER — Ambulatory Visit (HOSPITAL_COMMUNITY)
Admission: RE | Admit: 2023-12-11 | Discharge: 2023-12-11 | Disposition: A | Discharge status: Home / Self Care | Source: Ambulatory Visit | Attending: Gastroenterology | Admitting: Gastroenterology

## 2023-12-11 DIAGNOSIS — K6389 Other specified diseases of intestine: Secondary | ICD-10-CM | POA: Diagnosis present

## 2023-12-11 DIAGNOSIS — C189 Malignant neoplasm of colon, unspecified: Secondary | ICD-10-CM | POA: Diagnosis present

## 2023-12-11 MED ORDER — SODIUM CHLORIDE (PF) 0.9 % IJ SOLN
INTRAMUSCULAR | Status: AC
  Filled 2023-12-11: qty 50

## 2023-12-11 MED ORDER — IOHEXOL 9 MG/ML PO SOLN: 500.0000 mL | ORAL | Status: AC

## 2023-12-11 MED ORDER — IOHEXOL 300 MG/ML  SOLN
100.0000 mL | Freq: Once | INTRAMUSCULAR | Status: AC | PRN
  Administered 2023-12-11: 100 mL via INTRAVENOUS

## 2023-12-11 MED ORDER — IOHEXOL 9 MG/ML PO SOLN
ORAL | Status: AC
  Filled 2023-12-11: qty 1000

## 2023-12-13 ENCOUNTER — Ambulatory Visit: Payer: Self-pay | Admitting: Gastroenterology

## 2023-12-13 NOTE — Progress Notes (Signed)
 Please inform the patient's daughter Negative CT chest Abdo/pelvis for any distant mets. Pl make sure she has appt with Dr. Medford Pizza with surgery. Patient has appointment with Dr. Cornelius All results normal or at baseline. Send report to family physician

## 2023-12-15 ENCOUNTER — Inpatient Hospital Stay: Attending: Oncology

## 2023-12-15 ENCOUNTER — Encounter: Payer: Self-pay | Admitting: Oncology

## 2023-12-15 ENCOUNTER — Inpatient Hospital Stay: Admitting: Oncology

## 2023-12-15 VITALS — BP 135/71 | HR 68 | Temp 98.1°F | Resp 18 | Ht 64.0 in | Wt 144.1 lb

## 2023-12-15 DIAGNOSIS — Z803 Family history of malignant neoplasm of breast: Secondary | ICD-10-CM | POA: Insufficient documentation

## 2023-12-15 DIAGNOSIS — Z801 Family history of malignant neoplasm of trachea, bronchus and lung: Secondary | ICD-10-CM | POA: Insufficient documentation

## 2023-12-15 DIAGNOSIS — M810 Age-related osteoporosis without current pathological fracture: Secondary | ICD-10-CM | POA: Insufficient documentation

## 2023-12-15 DIAGNOSIS — E538 Deficiency of other specified B group vitamins: Secondary | ICD-10-CM | POA: Insufficient documentation

## 2023-12-15 DIAGNOSIS — D509 Iron deficiency anemia, unspecified: Secondary | ICD-10-CM | POA: Insufficient documentation

## 2023-12-15 DIAGNOSIS — Z79899 Other long term (current) drug therapy: Secondary | ICD-10-CM | POA: Diagnosis not present

## 2023-12-15 DIAGNOSIS — C182 Malignant neoplasm of ascending colon: Secondary | ICD-10-CM | POA: Insufficient documentation

## 2023-12-15 DIAGNOSIS — Z800 Family history of malignant neoplasm of digestive organs: Secondary | ICD-10-CM | POA: Insufficient documentation

## 2023-12-15 DIAGNOSIS — Z8051 Family history of malignant neoplasm of kidney: Secondary | ICD-10-CM | POA: Insufficient documentation

## 2023-12-15 DIAGNOSIS — C180 Malignant neoplasm of cecum: Secondary | ICD-10-CM | POA: Insufficient documentation

## 2023-12-15 LAB — CBC WITH DIFFERENTIAL (CANCER CENTER ONLY)
Abs Immature Granulocytes: 0.01 K/uL (ref 0.00–0.07)
Basophils Absolute: 0 K/uL (ref 0.0–0.1)
Basophils Relative: 1 %
Eosinophils Absolute: 0.1 K/uL (ref 0.0–0.5)
Eosinophils Relative: 2 %
HCT: 35.5 % — ABNORMAL LOW (ref 36.0–46.0)
Hemoglobin: 11 g/dL — ABNORMAL LOW (ref 12.0–15.0)
Immature Granulocytes: 0 %
Lymphocytes Relative: 29 %
Lymphs Abs: 1.2 K/uL (ref 0.7–4.0)
MCH: 24.8 pg — ABNORMAL LOW (ref 26.0–34.0)
MCHC: 31 g/dL (ref 30.0–36.0)
MCV: 80.1 fL (ref 80.0–100.0)
Monocytes Absolute: 0.2 K/uL (ref 0.1–1.0)
Monocytes Relative: 6 %
Neutro Abs: 2.6 K/uL (ref 1.7–7.7)
Neutrophils Relative %: 62 %
Platelet Count: 290 K/uL (ref 150–400)
RBC: 4.43 MIL/uL (ref 3.87–5.11)
RDW: 14.5 % (ref 11.5–15.5)
WBC Count: 4.2 K/uL (ref 4.0–10.5)
nRBC: 0 % (ref 0.0–0.2)

## 2023-12-15 LAB — CMP (CANCER CENTER ONLY)
ALT: 12 U/L (ref 0–44)
AST: 23 U/L (ref 15–41)
Albumin: 4.3 g/dL (ref 3.5–5.0)
Alkaline Phosphatase: 88 U/L (ref 38–126)
Anion gap: 11 (ref 5–15)
BUN: 16 mg/dL (ref 8–23)
CO2: 25 mmol/L (ref 22–32)
Calcium: 9.5 mg/dL (ref 8.9–10.3)
Chloride: 107 mmol/L (ref 98–111)
Creatinine: 0.97 mg/dL (ref 0.44–1.00)
GFR, Estimated: >60 mL/min (ref >60)
Glucose, Bld: 105 mg/dL — ABNORMAL HIGH (ref 70–99)
Potassium: 4.2 mmol/L (ref 3.5–5.1)
Sodium: 144 mmol/L (ref 135–145)
Total Bilirubin: 0.4 mg/dL (ref 0.0–1.2)
Total Protein: 7.4 g/dL (ref 6.5–8.1)

## 2023-12-15 LAB — IRON AND TIBC
Iron: 25 ug/dL — ABNORMAL LOW (ref 28–170)
Saturation Ratios: 5 % — ABNORMAL LOW (ref 10.4–31.8)
TIBC: 468 ug/dL — ABNORMAL HIGH (ref 250–450)
UIBC: 443 ug/dL

## 2023-12-15 LAB — CEA (ACCESS): CEA (CHCC): 8.27 ng/mL — ABNORMAL HIGH (ref 0.00–5.00)

## 2023-12-15 LAB — FERRITIN: Ferritin: 18 ng/mL (ref 11–307)

## 2023-12-15 NOTE — Progress Notes (Signed)
 [name]  [address] [place],  [place]  [address] [phone]  Clinic Day: 12/15/23  Referring physician: Thurmond Marissa LABOR., MD  ASSESSMENT & PLAN:  Assessment: Adenocarcinoma of the Cecum This was discovered during investigation of her iron deficiency. Endoscopy and colonoscopy was preformed on 12/03/2023 and multiple 4 to 5 mm sessile polyps were found in the gastric body which were resected.  A 3 cm infiltrative, polypoid, sessile and ulcerated non-obstructing mass was found in the proximal ascending colon/distal cecum, opposite ileocecal valve which was biopsied, and a 6 mm polyp just distal to the mass which was not removed. Pathology revealed this to be an invasive moderately differentiated colonic adenocarcinoma in the ascending colon. She will be seeing the surgeon for resection and I answered her questions. I explained that if the lymph nodes are positive, that would make this a stage III and I would recommend chemotherapy. She is very hesitant but we might be able to consider Capecitabine. We will discuss this further when she returns with the pathology.   Personal history of breast cancer Remote history of stage IIB hormone receptor positive breast cancer diagnosed in March 2007. She treated with lumpectomy, adjuvant radiation and partial adjuvant endocrine therapy.  Bilateral diagnostic mammogram and right breast ultrasound this month did not reveal any evidence of malignancy.    Iron deficiency anemia Iron deficiency anemia diagnosed in December 2024, felt to be most likely due to chronic GI blood loss.  The patient reported difficulties tolerating oral iron mainly due to constipation.  She received IV iron in the form of Feraheme  in April with a good response. She had evidence of recurrent iron deficiency and received IV Feraheme  in August and September, 2025. Her hemoglobin is only 11 but she may be having ongoing GI hemorrhage from the tumor. Repeat iron studies are  pending.    B12 deficiency History of B12 deficiency. She continues B12 monthly at Dr. Wyvonnia office.    Osteoporosis She is not on any treatment for this and very hesitant to consider any. I reviewed some of her options and she will think over.  She will follow up with MarissaGrisso regarding the monitoring and treatment of this.  Plan: She was last seen by our PA Marissa Black who found her to iron deficient with an hemoglobin of 8.9 and iron saturation of 4%. She was given an full dose of IV Feraheme  and referred her to Marissa Black for further evaluation. An endoscopy and colonoscopy was preformed on 12/03/2023 and multiple 4 to 5 mm sessile polyps were found in the gastric body which were resected.  A 3 cm infiltrative, polypoid, sessile and ulcerated non-obstructing mass was found in the proximal ascending colon/distal cecum, opposite ileocecal valve which was biopsied, and a 6 mm polyp just distal to the mass which was not removed. Pathology revealed this to be an invasive moderately differentiated colonic adenocarcinoma in the ascending colon. Patient then had an CT chest, abdomen, and pelvis done which revealed partially circumferential mass of the cecum at the level of the ileocecal valve measuring approximately 3.9 x 3.2 x 3.0 cm, prominent lymph nodes in the right lower quadrant mesocolon adjacent to mass measuring up to 0.7 cm, and no other evidence of lymphadenopathy or metastatic disease in the chest, abdomen, or pelvis. Patient states that she feels ok and she has no complaints of pain. She denies any dark stool or melena at this time and informed me that she has an upcoming appointment with  Dr. Lonni Black in [place] on the 17th. I informed her that she will need surgery and we are hoping on the least invasive approach such as laparoscopy, as she is understandably concerned. Patient then inquired if she would need treatment after surgery and I explained that this would depend on her pathology  and stage.  We are suspicious of stage III disease based on CT scan, so I would recommend postoperative adjuvant therapy.  I instructed her to call and inform me on what day her surgery will be scheduled. She has a WBC of 4.2, low hemoglobin of 11.0, and platelet count of 290,000 today. Her CMP is completely normal. Her CEA today is pending but on 12/03/2023 was elevated at 4.6. Her ferritin, iron, and TIBC is also pending and I will call her with the results. Patient continues B-12 injections monthly. I will see her back a few weeks after surgery with CBC, CMP, CEA, and to review the pathology results. The patient understands the plans discussed today and is in agreement with them.  She knows to contact our office if she develops concerns prior to her next appointment.   I provided 27 minutes of face-to-face time during this encounter and > 50% was spent counseling as documented under my assessment and plan.   Marissa VEAR Cornish, MD  [facility] [address] [place] [address] Dept: [phone] Dept Fax: [phone]   No orders of the defined types were placed in this encounter.  CHIEF COMPLAINT:  CC: Iron deficiency anemia  Current Treatment: IV iron as needed  HISTORY OF PRESENT ILLNESS:  Marissa Black is a 78 y.o. female with iron deficiency who was referred in consultation by Dr. Lynnie Black for assessment and management.  He saw her on April 11 for consideration of GI evaluation.  CBC revealed 11.9 with an MCV of 81, WBC 7.3 with a normal differential and platelets 282,000.  She had worsening iron was 32, TIBC 304 and iron saturation 6.5% and ferritin 7.5 despite oral iron supplement.   She was scheduled for CT abdomen and pelvis April 24, with plans for EGD and colonoscopy.    She saw Dr. Thurmond on December 30 for routine follow-up.  CBC without differential revealed WBCs 5.4, hemoglobin  8.5 with an MCV 71, and platelets 362,000.  CMP was unremarkable. She was called to come back for additional testing, but to the emergency room if she experiences any bleeding.  She did end up going to to [facility] ER on January 2 with a conjunctival hemorrhage.  CBC revealed WBC 6.9, hemoglobin 9.3, MCV 78, platelets 389,000.  She had evidence of iron deficiency with serum iron 22, TIBC 466, saturation 4.7% and ferritin 5.9.  Stool Hemoccult was negative. She was started on ferrous sulfate daily.  She was seen again by Merlynn Push at Dr. Wyvonnia office on January 27.  CBC revealed hemoglobin 9.4 with an MCV of 71, WBCs 2.7, with 63% neutrophils, 38% lymphocytes, 8% monocytes, and 1% basophils, and platelets 277,000.  Iron studies were equivocal with iron 201, TIBC 442 and iron saturation 45%.  Ferritin was 14. Oral iron was continued.   Of note, she also has a history of B12 deficiency and continues B12 monthly at Dr. Wyvonnia office.  She also has a remote history of stage IIB hormone and HER2 receptor positive breast cancer treated with lumpectomy, adjuvant chemotherapy/HER2 targeted therapy  and adjuvant radiation. She was placed on anastrozole in November 2008, but but stopped it on her own in March 2010 because of increasing arthralgias.  Due to her personal and family history of malignancy, she underwent genetic testing for hereditary cancer syndromes with the [employer] multi cancer panel test in March 2018.  This did not reveal any clinically significant mutations or variants of uncertain significance.  Her last mammogram was in August 2023 and did not reveal any evidence of malignancy.  Dr. Cornelius discharged her from clinic last year, as she follows closely with Dr. Thurmond and he had been ordering her mammograms.    The patient reported difficulty tolerating oral iron due to constipation.  She is on aspirin for atherosclerosis and stated Marissa Black did not recommend holding her aspirin.  She  received IV iron in the form of Feraheme  in April with a good response.   CT abdomen and pelvis in April did not reveal any acute findings.  Extensive colonic diverticulosis without diverticulitis and a tiny hiatal hernia were seen.  She states she canceled her EGD and colonoscopy as her daughter became very ill and still requires surgery.   INTERVAL HISTORY:  Marissa Black is here today for clinical assessment of her newly diagnosed ascending colon adenocarcinoma. She was last seen by our PA [facility] who found her to iron deficient with an hemoglobin of 8.9 and iron saturation of 4%. She was given an full dose of IV Feraheme  and referred her to Marissa Black for further evaluation. An endoscopy and colonoscopy was preformed on 12/03/2023 and multiple 4 to 5 mm sessile polyps were found in the gastric body which were resected. A 3 cm infiltrative, polypoid, sessile and ulcerated non-obstructing mass was found in the proximal ascending colon/distal cecum, opposite ileocecal valve which was biopsied, and a 6 mm polyp just distal to the mass which was not removed. Pathology revealed this to be an invasive moderately differentiated colonic adenocarcinoma in the ascending colon. Patient then had an CT chest, abdomen, and pelvis done which revealed partially circumferential mass of the cecum at the level of the ileocecal valve measuring approximately 3.9 x 3.2 x 3.0 cm, prominent lymph nodes in the right lower quadrant mesocolon adjacent to mass measuring up to 0.7 cm, and no other evidence of lymphadenopathy or metastatic disease in the chest, abdomen, or pelvis. Patient states that she feels ok and she has no complaints of pain. She denies any dark stool or melena at this time and informed me that she has an upcoming appointment with Dr. Lonni Black in [place] on the 17th. I informed her that she will need surgery and we are hoping on the least invasive approach such as laparoscopy, as she is understandably concerned.  Patient then inquired if she would need treatment after surgery and I explained that this would depend on her pathology and stage. We are suspicious of stage III disease based on CT scan, so I would recommend postoperative adjuvant therapy.  I instructed her to call and inform me on what day her surgery will be scheduled. She has a WBC of 4.2, low hemoglobin of 11.0, and platelet count of 290,000 today. Her CMP is completely normal. Her CEA today is pending but on 12/03/2023 was elevated at 4.6. Her ferritin, iron, and TIBC is also pending and I will call her with the results. Patient continues B-12 injections monthly. I will see her back a few weeks after surgery with CBC, CMP, CEA, and to review the pathology  results.   She denies fever, chills, night sweats, or other signs of infection. She denies cardiorespiratory and gastrointestinal issues. She  denies pain. Her appetite is good and Her weight has decreased 8 pounds over last 2 months.  REVIEW OF SYSTEMS:  Review of Systems  Constitutional:  Positive for fatigue. Negative for appetite change, chills, diaphoresis, fever and unexpected weight change.  HENT:  Negative.  Negative for hearing loss, lump/mass, mouth sores, nosebleeds, sore throat, tinnitus, trouble swallowing and voice change.   Eyes: Negative.   Respiratory: Negative.  Negative for chest tightness, cough, hemoptysis, shortness of breath and wheezing.   Cardiovascular: Negative.  Negative for chest pain, leg swelling and palpitations.  Gastrointestinal: Negative.  Negative for abdominal distention, abdominal pain, blood in stool, constipation, diarrhea, nausea and vomiting.  Endocrine: Negative.  Negative for hot flashes.  Genitourinary: Negative.  Negative for difficulty urinating, dysuria, frequency, hematuria and vaginal bleeding.   Musculoskeletal:  Negative for arthralgias, back pain, flank pain, gait problem, myalgias, neck pain and neck stiffness.  Skin: Negative.  Negative for  itching, rash and wound.  Neurological:  Negative for dizziness, extremity weakness, gait problem, headaches, light-headedness, numbness, seizures and speech difficulty.  Hematological: Negative.  Negative for adenopathy. Does not bruise/bleed easily.  Psychiatric/Behavioral:  Negative for depression and sleep disturbance. The patient is nervous/anxious.     VITALS:  Blood pressure 135/71, pulse 68, temperature 98.1 F (36.7 C), temperature source Oral, resp. rate 18, height 5' 4 (1.626 m), weight 144 lb 1.6 oz (65.4 kg), SpO2 100%.  Wt Readings from Last 3 Encounters:  12/15/23 144 lb 1.6 oz (65.4 kg)  12/03/23 148 lb (67.1 kg)  11/25/23 148 lb (67.1 kg)    Body mass index is 24.73 kg/m.  Performance status (ECOG): 1 - Symptomatic but completely ambulatory  PHYSICAL EXAM:  Physical Exam Vitals and nursing note reviewed.  Constitutional:      General: She is not in acute distress.    Appearance: Normal appearance. She is normal weight.  HENT:     Head: Normocephalic and atraumatic.     Right Ear: Tympanic membrane, ear canal and external ear normal. There is no impacted cerumen.     Left Ear: Tympanic membrane, ear canal and external ear normal. There is no impacted cerumen.     Nose: Nose normal. No congestion or rhinorrhea.     Mouth/Throat:     Mouth: Mucous membranes are moist.     Pharynx: Oropharynx is clear. No oropharyngeal exudate or posterior oropharyngeal erythema.  Eyes:     General: No scleral icterus.       Right eye: No discharge.        Left eye: No discharge.     Extraocular Movements: Extraocular movements intact.     Conjunctiva/sclera: Conjunctivae normal.     Pupils: Pupils are equal, round, and reactive to light.  Cardiovascular:     Rate and Rhythm: Normal rate and regular rhythm.     Pulses: Normal pulses.     Heart sounds: Normal heart sounds. No murmur heard.    No friction rub. No gallop.  Pulmonary:     Effort: Pulmonary effort is normal. No  respiratory distress.     Breath sounds: Normal breath sounds. No wheezing, rhonchi or rales.  Abdominal:     General: Bowel sounds are normal. There is no distension.     Palpations: Abdomen is soft. There is no hepatomegaly, splenomegaly or mass.     Tenderness: There  is no abdominal tenderness.     Comments: Large right upper quadrant scar  Musculoskeletal:        General: Normal range of motion.     Cervical back: Normal range of motion and neck supple. No tenderness.     Right lower leg: No edema.     Left lower leg: No edema.  Lymphadenopathy:     Cervical: No cervical adenopathy.     Right cervical: No superficial, deep or posterior cervical adenopathy.    Left cervical: No superficial, deep or posterior cervical adenopathy.     Upper Body:     Right upper body: No supraclavicular, axillary or pectoral adenopathy.     Left upper body: No supraclavicular, axillary or pectoral adenopathy.     Lower Body: No right inguinal adenopathy. No left inguinal adenopathy.  Skin:    General: Skin is warm and dry.     Coloration: Skin is not jaundiced.     Findings: No rash.  Neurological:     General: No focal deficit present.     Mental Status: She is alert and oriented to person, place, and time. Mental status is at baseline.     Cranial Nerves: No cranial nerve deficit.  Psychiatric:        Mood and Affect: Mood normal.        Behavior: Behavior normal.        Thought Content: Thought content normal.        Judgment: Judgment normal.    LABS:      Latest Ref Rng & Units 12/15/2023    8:02 AM 12/03/2023   11:44 AM 09/28/2023    9:35 AM  CBC  WBC 4.0 - 10.5 K/uL 4.2  5.1  4.3   Hemoglobin 12.0 - 15.0 g/dL 88.9  88.6  8.9   Hematocrit 36.0 - 46.0 % 35.5  35.4  28.6   Platelets 150 - 400 K/uL 290  308.0  298       Latest Ref Rng & Units 12/15/2023    8:02 AM 12/03/2023   11:44 AM 05/22/2023   11:28 AM  CMP  Glucose 70 - 99 mg/dL 894  85  899   BUN 8 - 23 mg/dL 16  18  16     Creatinine 0.44 - 1.00 mg/dL 9.02  9.22  9.14   Sodium 135 - 145 mmol/L 144  142  141   Potassium 3.5 - 5.1 mmol/L 4.2  4.3  4.4   Chloride 98 - 111 mmol/L 107  108  106   CO2 22 - 32 mmol/L 25  24  28    Calcium 8.9 - 10.3 mg/dL 9.5  9.2  9.3   Total Protein 6.5 - 8.1 g/dL 7.4  7.4  6.8   Total Bilirubin 0.0 - 1.2 mg/dL 0.4  0.5  0.4   Alkaline Phos 38 - 126 U/L 88  72  69   AST 15 - 41 U/L 23  17  17    ALT 0 - 44 U/L 12  11  11     Lab Results  Component Value Date   TOTALPROTELP 6.8 05/26/2023   ALBUMINELP 3.7 05/26/2023   A1GS 0.3 05/26/2023   A2GS 0.7 05/26/2023   BETS 1.1 05/26/2023   GAMS 1.1 05/26/2023   MSPIKE Not Observed 05/26/2023   Lab Results  Component Value Date   TIBC 468 (H) 12/15/2023   TIBC 462 (H) 09/28/2023   TIBC 358 07/28/2023   FERRITIN 18 12/15/2023  FERRITIN 4 (L) 09/28/2023   FERRITIN 25 07/28/2023   IRONPCTSAT 5 (L) 12/15/2023   IRONPCTSAT 4 (L) 09/28/2023   IRONPCTSAT 16 07/28/2023   Lab Results  Component Value Date   LDH 173 06/11/2005    STUDIES:  CT CHEST ABDOMEN PELVIS W CONTRAST Result Date: 12/13/2023 CLINICAL DATA:  Ascending colon cancer staging, additional history of breast cancer * Tracking Code: BO * EXAM: CT CHEST, ABDOMEN, AND PELVIS WITH CONTRAST TECHNIQUE: Multidetector CT imaging of the chest, abdomen and pelvis was performed following the standard protocol during bolus administration of intravenous contrast. RADIATION DOSE REDUCTION: This exam was performed according to the departmental dose-optimization program which includes automated exposure control, adjustment of the mA and/or kV according to patient size and/or use of iterative reconstruction technique. CONTRAST:  OMNIPAQUE  IOHEXOL  300 MG/ML SOLN additional oral enteric contrast COMPARISON:  CT abdomen pelvis, 06/04/2023 FINDINGS: CT CHEST FINDINGS Cardiovascular: Aortic atherosclerosis. Normal heart size. No pericardial effusion. Mediastinum/Nodes: No enlarged  mediastinal, hilar, or axillary lymph nodes. Thyroid  gland, trachea, and esophagus demonstrate no significant findings. Lungs/Pleura: Subpleural radiation fibrosis of the anterior right lung. Scarring or atelectasis of the bilateral lung bases. No pleural effusion or pneumothorax. Musculoskeletal: Right lumpectomy. No acute osseous findings. Disc degenerative disease and bridging osteophytosis throughout the thoracic spine in keeping with DISH. CT ABDOMEN PELVIS FINDINGS Hepatobiliary: No focal liver abnormality is seen. Status post cholecystectomy. Mild postoperative biliary ductal dilatation biliary dilatation. Pancreas: Unremarkable. No pancreatic ductal dilatation or surrounding inflammatory changes. Spleen: Normal in size without significant abnormality. Adrenals/Urinary Tract: Adrenal glands are unremarkable. Kidneys are normal, without renal calculi, solid lesion, or hydronephrosis. Bladder is unremarkable. Stomach/Bowel: Stomach is within normal limits. Appendix not clearly visualized. Partially circumferential mass of the cecum at the level of the ileocecal valve measuring approximately 3.9 x 3.2 x 3.0 cm (series 3, image 83). Descending and sigmoid diverticulosis. No evidence of bowel distention. Vascular/Lymphatic: No significant vascular findings are present. Prominent lymph nodes in the right lower quadrant mesocolon adjacent to mass measuring up to 0.7 cm (series 3, image 81). No other enlarged lymph nodes in the abdomen or pelvis. Reproductive: Hysterectomy. Other: Small fat containing right inguinal hernia.  No ascites. Musculoskeletal: No acute osseous findings. Intramedullary nail fixation of the left hip. IMPRESSION: 1. Partially circumferential mass of the cecum at the level of the ileocecal valve measuring approximately 3.9 x 3.2 x 3.0 cm, consistent with known primary colonic malignancy. 2. Prominent lymph nodes in the right lower quadrant mesocolon adjacent to mass measuring up to 0.7 cm,  suspicious for early nodal metastatic disease. 3. No other evidence of lymphadenopathy or metastatic disease in the chest, abdomen, or pelvis. 4. Right lumpectomy Aortic Atherosclerosis (ICD10-I70.0). Electronically Signed   By: Marolyn JONETTA Jaksch M.D.   On: 12/13/2023 15:55    Exam: 12/03/2023 Surgical Pathology FINAL DIAGNOSIS:      1. Duodenum, Biopsy,  :       DUODENAL MUCOSA WITH NORMAL VILLOUS ARCHITECTURE.       NO VILLOUS ATROPHY OR INCREASED INTRAEPITHELIAL LYMPHOCYTES.        2. Stomach, biopsy,  :       FUNDIC GLAND POLYP.       NEGATIVE FOR DYSPLASIA AND MALIGNANCY.        3. Ascending Colon Biopsy,  :       INVASIVE MODERATELY DIFFERENTIATED COLONIC ADENOCARCINOMA.       SEE NOTE.        Diagnosis Note : MMR will be  performed and reported as an addendum.       Dr. Macie reviewed part 3 of this case and agrees.       Marissa Black was notified on 12/04/2023 at 8:45 AM.       HISTORY:   Past Medical History:  Diagnosis Date   Age-related osteoporosis without current pathological fracture    Allergy    Anemia    Arthritis    Atherosclerosis of vertebral artery    Cancer (HCC)    Cervical spinal stenosis    Degenerative cervical disc    Essential hypertension    Family history of breast cancer    Family history of kidney cancer    Family history of stomach cancer    GERD (gastroesophageal reflux disease)    History of cardiac arrhythmia    History of hypothyroidism    Kidney cyst, acquired    Personal history of breast cancer    Personal history of chemotherapy    Personal history of radiation therapy    Recurrent UTI    RLS (restless legs syndrome)    S/p left hip fracture    Tinnitus    Vitamin B12 deficiency     Past Surgical History:  Procedure Laterality Date   BREAST LUMPECTOMY Right 2007   x2   CHOLECYSTECTOMY     left hip fracture Left    PARTIAL HYSTERECTOMY      Family History  Problem Relation Age of Onset   Kidney cancer Mother        dx in  her 67s   Congestive Heart Failure Father    Lung cancer Brother        smoker   Cirrhosis Maternal Aunt    Cirrhosis Maternal Uncle    Cirrhosis Maternal Uncle    Stomach cancer Maternal Grandmother        dx in her 75s   Breast cancer Cousin 25       maternal first cousin   Breast cancer Cousin        dx in her 23s; maternal first cousin   Breast cancer Cousin        dx in her 22s, maternal first cousin   Liver disease Neg Hx    Esophageal cancer Neg Hx    Colon cancer Neg Hx    Rectal cancer Neg Hx     Social History:  reports that she has never smoked. She has never used smokeless tobacco. She reports that she does not drink alcohol and does not use drugs.The patient is alone today.  Allergies:  Allergies  Allergen Reactions   Codeine Itching    Current Medications: Current Outpatient Medications  Medication Sig Dispense Refill   amLODipine (NORVASC) 5 MG tablet Take by mouth.     aspirin EC 81 MG tablet Take by mouth.     cyanocobalamin  (VITAMIN B12) 1000 MCG/ML injection Inject into the muscle.     fluticasone  (FLONASE ) 50 MCG/ACT nasal spray Place 1 spray into both nostrils daily.     pantoprazole (PROTONIX) 40 MG tablet Take 40 mg by mouth daily.     No current facility-administered medications for this visit.    I,Jasmine M Lassiter,acting as a scribe for Marissa VEAR Cornish, MD.,have documented all relevant documentation on the behalf of Marissa VEAR Cornish, MD,as directed by  Marissa VEAR Cornish, MD while in the presence of Marissa VEAR Cornish, MD.

## 2023-12-23 ENCOUNTER — Encounter: Payer: Self-pay | Admitting: Hematology and Oncology

## 2023-12-24 ENCOUNTER — Other Ambulatory Visit: Payer: Self-pay

## 2023-12-24 NOTE — Progress Notes (Signed)
 This information is for discussion at Tumor Board and not part of the tx plan.

## 2023-12-28 ENCOUNTER — Other Ambulatory Visit: Payer: Self-pay

## 2023-12-28 NOTE — Progress Notes (Signed)
 Documented in error.

## 2023-12-29 ENCOUNTER — Ambulatory Visit: Admitting: Hematology and Oncology

## 2023-12-29 ENCOUNTER — Other Ambulatory Visit

## 2023-12-31 NOTE — Patient Instructions (Addendum)
 SURGICAL WAITING ROOM VISITATION Patients having surgery or a procedure may have no more than 2 support people in the waiting area - these visitors may rotate.    Children under the age of 37 must have an adult with them who is not the patient.  If the patient needs to stay at the hospital during part of their recovery, the visitor guidelines for inpatient rooms apply. Pre-op nurse will coordinate an appropriate time for 1 support person to accompany patient in pre-op.  This support person may not rotate.    Please refer to the [facility] website for the visitor guidelines for Inpatients (after your surgery is over and you are in a regular room).       Your procedure is scheduled on: 01-13-24   Report to [facility] Main Entrance    Report to admitting at 6:15 AM   Call this number if you have problems the morning of surgery [phone]   Follow a clear liquid the day prior to surgery to prevent dehydration .   After Midnight you may have the following liquids until 5:30 AM DAY OF SURGERY  Water Non-Citrus Juices (without pulp, NO RED-Apple, White grape, White cranberry) Black Coffee (NO MILK/CREAM OR CREAMERS, sugar ok)  Clear Tea (NO MILK/CREAM OR CREAMERS, sugar ok) regular and decaf                             Plain Jell-O (NO RED)                                           Fruit ices (not with fruit pulp, NO RED)                                     Popsicles (NO RED)                                                               Sports drinks like Gatorade (NO RED)  Drink 2 Pre-Surgery Clear Ensure the evening before surgery                    The day of surgery:  Drink ONE (1) Pre-Surgery Clear Ensure or G2 by 5:30 AM the morning of surgery. Drink in one sitting. Do not sip.  This drink was given to you during your hospital  pre-op appointment visit. Nothing else to drink after completing the Pre-Surgery Clear Ensure or G2.          If you have questions,  please contact your surgeon's office.   FOLLOW BOWEL PREP AND ANY ADDITIONAL PRE OP INSTRUCTIONS YOU RECEIVED FROM YOUR SURGEON'S OFFICE!!!     Oral Hygiene is also important to reduce your risk of infection.                                    Remember - BRUSH YOUR TEETH THE MORNING OF SURGERY WITH YOUR REGULAR TOOTHPASTE   Do NOT  smoke after Midnight   Take these medicines the morning of surgery with A SIP OF WATER:    Amlodipine   Pantoprazole   Okay to use Flonase  nasal spray  Stop all vitamins and herbal supplements 7 days before surgery  Aspirin - check to see if okay to hold                              You may not have any metal on your body including hair pins, jewelry, and body piercing             Do not wear make-up, lotions, powders, perfumes or deodorant  Do not wear nail polish including gel and S&S, artificial/acrylic nails, or any other type of covering on natural nails including finger and toenails. If you have artificial nails, gel coating, etc. that needs to be removed by a nail salon please have this removed prior to surgery or surgery may need to be canceled/ delayed if the surgeon/ anesthesia feels like they are unable to be safely monitored.   Do not shave  48 hours prior to surgery.     Do not bring valuables to the hospital. [facility] IS NOT RESPONSIBLE   FOR VALUABLES.   Contacts, dentures or bridgework may not be worn into surgery.   Bring small overnight bag day of surgery.   DO NOT BRING YOUR HOME MEDICATIONS TO THE HOSPITAL. PHARMACY WILL DISPENSE MEDICATIONS LISTED ON YOUR MEDICATION LIST TO YOU DURING YOUR ADMISSION IN THE HOSPITAL!     Special Instructions: Bring a copy of your healthcare power of attorney and living will documents the day of surgery if you haven't scanned them before.              Please read over the following fact sheets you were given: IF YOU HAVE QUESTIONS ABOUT YOUR PRE-OP INSTRUCTIONS PLEASE CALL [phone] [facility]  If  you received a COVID test during your pre-op visit  it is requested that you wear a mask when out in public, stay away from anyone that may not be feeling well and notify your surgeon if you develop symptoms. If you test positive for Covid or have been in contact with anyone that has tested positive in the last 10 days please notify you surgeon.  [facility] - Preparing for Surgery Before surgery, you can play an important role.  Because skin is not sterile, your skin needs to be as free of germs as possible.  You can reduce the number of germs on your skin by washing with CHG (chlorahexidine gluconate) soap before surgery.  CHG is an antiseptic cleaner which kills germs and bonds with the skin to continue killing germs even after washing. Please DO NOT use if you have an allergy to CHG or antibacterial soaps.  If your skin becomes reddened/irritated stop using the CHG and inform your nurse when you arrive at Short Stay. Do not shave (including legs and underarms) for at least 48 hours prior to the first CHG shower.  You may shave your face/neck.  Please follow these instructions carefully:  1.  Shower with CHG Soap the night before surgery ONLY (DO NOT USE THE SOAP THE MORNING OF SURGERY).  2.  If you choose to wash your hair, wash your hair first as usual with your normal  shampoo.  3.  After you shampoo, rinse your hair and body thoroughly to remove the shampoo.  4.  Use CHG as you would any other liquid soap.  You can apply chg directly to the skin and wash.  Gently with a scrungie or clean washcloth.  5.  Apply the CHG Soap to your body ONLY FROM THE NECK DOWN.   Do   not use on face/ open                           Wound or open sores. Avoid contact with eyes, ears mouth and   genitals (private parts).                       Wash face,  Genitals (private parts) with your normal soap.             6.  Wash thoroughly, paying special attention to the area where your     surgery  will be performed.  7.  Thoroughly rinse your body with warm water from the neck down.  8.  DO NOT shower/wash with your normal soap after using and rinsing off the CHG Soap.                9.  Pat yourself dry with a clean towel.            10.  Wear clean pajamas.            11.  Place clean sheets on your bed the night of your first shower and do not  sleep with pets. Day of Surgery : Do not apply any CHG, lotions/deodorants the morning of surgery.  Please wear clean clothes to the hospital/surgery center.  FAILURE TO FOLLOW THESE INSTRUCTIONS MAY RESULT IN THE CANCELLATION OF YOUR SURGERY  PATIENT SIGNATURE_________________________________  NURSE SIGNATURE__________________________________  ________________________________________________________________________  WHAT IS A BLOOD TRANSFUSION? Blood Transfusion Information  A transfusion is the replacement of blood or some of its parts. Blood is made up of multiple cells which provide different functions. Red blood cells carry oxygen and are used for blood loss replacement. White blood cells fight against infection. Platelets control bleeding. Plasma helps clot blood. Other blood products are available for specialized needs, such as hemophilia or other clotting disorders. BEFORE THE TRANSFUSION  Who gives blood for transfusions?  Healthy volunteers who are fully evaluated to make sure their blood is safe. This is blood bank blood. Transfusion therapy is the safest it has ever been in the practice of medicine. Before blood is taken from a donor, a complete history is taken to make sure that person has no history of diseases nor engages in risky social behavior (examples are intravenous drug use or sexual activity with multiple partners). The donor's travel history is screened to minimize risk of transmitting infections, such as malaria. The donated blood is tested for signs of infectious diseases, such as HIV and hepatitis.  The blood is then tested to be sure it is compatible with you in order to minimize the chance of a transfusion reaction. If you or a relative donates blood, this is often done in anticipation of surgery and is not appropriate for emergency situations. It takes many days to process the donated blood. RISKS AND COMPLICATIONS Although transfusion therapy is very safe and saves many lives, the main dangers of transfusion include:  Getting an infectious disease. Developing a transfusion reaction. This is an allergic reaction to something in the blood you were given. Every precaution is taken to prevent this. The decision  to have a blood transfusion has been considered carefully by your caregiver before blood is given. Blood is not given unless the benefits outweigh the risks. AFTER THE TRANSFUSION Right after receiving a blood transfusion, you will usually feel much better and more energetic. This is especially true if your red blood cells have gotten low (anemic). The transfusion raises the level of the red blood cells which carry oxygen, and this usually causes an energy increase. The nurse administering the transfusion will monitor you carefully for complications. HOME CARE INSTRUCTIONS  No special instructions are needed after a transfusion. You may find your energy is better. Speak with your caregiver about any limitations on activity for underlying diseases you may have. SEEK MEDICAL CARE IF:  Your condition is not improving after your transfusion. You develop redness or irritation at the intravenous (IV) site. SEEK IMMEDIATE MEDICAL CARE IF:  Any of the following symptoms occur over the next 12 hours: Shaking chills. You have a temperature by mouth above 102 F (38.9 C), not controlled by medicine. Chest, back, or muscle pain. People around you feel you are not acting correctly or are confused. Shortness of breath or difficulty breathing. Dizziness and fainting. You get a rash or develop  hives. You have a decrease in urine output. Your urine turns a dark color or changes to pink, red, or brown. Any of the following symptoms occur over the next 10 days: You have a temperature by mouth above 102 F (38.9 C), not controlled by medicine. Shortness of breath. Weakness after normal activity. The white part of the eye turns yellow (jaundice). You have a decrease in the amount of urine or are urinating less often. Your urine turns a dark color or changes to pink, red, or brown. Document Released: 01/25/2000 Document Revised: 04/21/2011 Document Reviewed: 09/13/2007 [facility] Patient Information 2014 [place], [place].  _______________________________________________________________________

## 2023-12-31 NOTE — Progress Notes (Addendum)
 Date of COVID positive in last 90 days:  No  PCP - Cathlyn Nash, MD Cardiologist - N/A  Chest x-ray - CT chest 12-11-23 Epic EKG - 01-05-24 Epic Stress Test - N/A ECHO - 04-02-06 Epic Cardiac Cath - N/A Pacemaker/ICD device last checked:N/A Spinal Cord Stimulator:N/A  Bowel Prep -  Yes, patient has instructions  Sleep Study - N/A CPAP -   Fasting Blood Sugar - N/A Checks Blood Sugar _____ times a day  Last dose of GLP1 agonist-  N/A GLP1 instructions:  Do not take after     Last dose of SGLT-2 inhibitors-  N/A SGLT-2 instructions:  Do not take after    Blood Thinner Instructions: N/A Last dose:   Time: Aspirin Instructions:  ASA 81 (will check with Dr. Grisso to see if she needs to stop) Last Dose:  Activity level:  Can go up a flight of stairs and perform activities of daily living without stopping and without symptoms of chest pain or shortness of breath.  Anesthesia review: Hemoglobin 9.5 on preop labs  Patient denies shortness of breath, fever, cough and chest pain at PAT appointment  Patient verbalized understanding of instructions that were given to them at the PAT appointment. Patient was also instructed that they will need to review over the PAT instructions again at home before surgery.

## 2023-12-31 NOTE — Progress Notes (Signed)
 Surgery orders requested via Epic inbox.

## 2024-01-01 ENCOUNTER — Ambulatory Visit: Payer: Self-pay | Admitting: Surgery

## 2024-01-01 DIAGNOSIS — Z01818 Encounter for other preprocedural examination: Secondary | ICD-10-CM

## 2024-01-05 ENCOUNTER — Other Ambulatory Visit: Payer: Self-pay

## 2024-01-05 ENCOUNTER — Encounter (HOSPITAL_COMMUNITY): Payer: Self-pay

## 2024-01-05 ENCOUNTER — Encounter (HOSPITAL_COMMUNITY)
Admission: RE | Admit: 2024-01-05 | Discharge: 2024-01-05 | Disposition: A | Discharge status: Home / Self Care | Source: Ambulatory Visit | Attending: Surgery | Admitting: Surgery

## 2024-01-05 VITALS — BP 139/79 | HR 85 | Temp 97.8°F | Resp 16 | Ht 64.0 in | Wt 146.4 lb

## 2024-01-05 DIAGNOSIS — D649 Anemia, unspecified: Secondary | ICD-10-CM | POA: Diagnosis not present

## 2024-01-05 DIAGNOSIS — I10 Essential (primary) hypertension: Secondary | ICD-10-CM | POA: Insufficient documentation

## 2024-01-05 DIAGNOSIS — Z01818 Encounter for other preprocedural examination: Secondary | ICD-10-CM | POA: Diagnosis present

## 2024-01-05 HISTORY — DX: Personal history of urinary calculi: Z87.442

## 2024-01-05 HISTORY — DX: Nausea with vomiting, unspecified: R11.2

## 2024-01-05 HISTORY — DX: Malignant neoplasm of colon, unspecified: C18.9

## 2024-01-05 LAB — CBC WITH DIFFERENTIAL/PLATELET
Abs Immature Granulocytes: 0.01 K/uL (ref 0.00–0.07)
Basophils Absolute: 0 K/uL (ref 0.0–0.1)
Basophils Relative: 1 %
Eosinophils Absolute: 0 K/uL (ref 0.0–0.5)
Eosinophils Relative: 1 %
HCT: 31.7 % — ABNORMAL LOW (ref 36.0–46.0)
Hemoglobin: 9.5 g/dL — ABNORMAL LOW (ref 12.0–15.0)
Immature Granulocytes: 0 %
Lymphocytes Relative: 16 %
Lymphs Abs: 1 K/uL (ref 0.7–4.0)
MCH: 24.1 pg — ABNORMAL LOW (ref 26.0–34.0)
MCHC: 30 g/dL (ref 30.0–36.0)
MCV: 80.5 fL (ref 80.0–100.0)
Monocytes Absolute: 0.4 K/uL (ref 0.1–1.0)
Monocytes Relative: 7 %
Neutro Abs: 5.1 K/uL (ref 1.7–7.7)
Neutrophils Relative %: 75 %
Platelets: 326 K/uL (ref 150–400)
RBC: 3.94 MIL/uL (ref 3.87–5.11)
RDW: 13.5 % (ref 11.5–15.5)
WBC: 6.6 K/uL (ref 4.0–10.5)
nRBC: 0 % (ref 0.0–0.2)

## 2024-01-05 LAB — COMPREHENSIVE METABOLIC PANEL WITH GFR
ALT: 9 U/L (ref 0–44)
AST: 19 U/L (ref 15–41)
Albumin: 4.1 g/dL (ref 3.5–5.0)
Alkaline Phosphatase: 84 U/L (ref 38–126)
Anion gap: 8 (ref 5–15)
BUN: 21 mg/dL (ref 8–23)
CO2: 26 mmol/L (ref 22–32)
Calcium: 9.2 mg/dL (ref 8.9–10.3)
Chloride: 107 mmol/L (ref 98–111)
Creatinine, Ser: 0.91 mg/dL (ref 0.44–1.00)
GFR, Estimated: >60 mL/min (ref >60)
Glucose, Bld: 81 mg/dL (ref 70–99)
Potassium: 3.8 mmol/L (ref 3.5–5.1)
Sodium: 141 mmol/L (ref 135–145)
Total Bilirubin: 0.3 mg/dL (ref 0.0–1.2)
Total Protein: 6.9 g/dL (ref 6.5–8.1)

## 2024-01-12 NOTE — Anesthesia Preprocedure Evaluation (Signed)
 Anesthesia Evaluation  Patient identified by MRN, date of birth, ID band Patient awake    Reviewed: Allergy & Precautions, NPO status , Patient's Chart, lab work & pertinent test results  History of Anesthesia Complications (+) PONV and history of anesthetic complications  Airway Mallampati: II  TM Distance: >3 FB Neck ROM: Full    Dental no notable dental hx.    Pulmonary neg pulmonary ROS   Pulmonary exam normal        Cardiovascular hypertension, Pt. on medications Normal cardiovascular exam     Neuro/Psych RLS    GI/Hepatic Neg liver ROS,GERD  Medicated,,Colon ca   Endo/Other  negative endocrine ROS    Renal/GU negative Renal ROS     Musculoskeletal  (+) Arthritis ,    Abdominal   Peds  Hematology  (+) Blood dyscrasia (Hgb 9.5), anemia   Anesthesia Other Findings   Reproductive/Obstetrics                              Anesthesia Physical Anesthesia Plan  ASA: 2  Anesthesia Plan: General   Post-op Pain Management: Tylenol  PO (pre-op)*   Induction: Intravenous  PONV Risk Score and Plan: 4 or greater and Treatment may vary due to age or medical condition, Ondansetron , Dexamethasone , Midazolam, Propofol  infusion and TIVA  Airway Management Planned: Oral ETT  Additional Equipment: None  Intra-op Plan:   Post-operative Plan: Extubation in OR  Informed Consent: I have reviewed the patients History and Physical, chart, labs and discussed the procedure including the risks, benefits and alternatives for the proposed anesthesia with the patient or authorized representative who has indicated his/her understanding and acceptance.     Dental advisory given  Plan Discussed with: CRNA  Anesthesia Plan Comments:          Anesthesia Quick Evaluation

## 2024-01-12 NOTE — Progress Notes (Signed)
 [facility]  PROCEDURAL EXPEDITER PROGRESS NOTE  Patient Name: Marissa Black  DOB:[birth year] Date of Admission: (Not on file)  Date of Assessment:01/12/24   -------------------------------------------------------------------------------------------------------------------   Brief clinical summary: ptis a 78 yr old female with a Hx of HTN and colon cancer.  Having surgery on 01/13/2024 lap parial colectomy  Orders in place:  Yes   Communication with surgical team if no orders: n/a  Labs, test, and orders reviewed: yes  Requires surgical clearance:  No  What type of clearance: n/a  Clearance received: n/a  Barriers noted:none   Intervention provided by [facility] team: n/a  Barrier resolved:  not applicable   -------------------------------------------------------------------------------------------------------------------  [employer], Ronal DELENA Bald Please contact us  directly via secure chat (search for [facility]) or by calling us  at [phone] [facility]).

## 2024-01-13 ENCOUNTER — Encounter (HOSPITAL_COMMUNITY): Admission: RE | Disposition: A | Payer: Self-pay | Source: Home / Self Care | Attending: Surgery

## 2024-01-13 ENCOUNTER — Other Ambulatory Visit (HOSPITAL_COMMUNITY): Payer: Self-pay

## 2024-01-13 ENCOUNTER — Encounter (HOSPITAL_COMMUNITY): Payer: Self-pay | Admitting: Surgery

## 2024-01-13 ENCOUNTER — Other Ambulatory Visit: Payer: Self-pay

## 2024-01-13 ENCOUNTER — Inpatient Hospital Stay (HOSPITAL_COMMUNITY): Payer: Self-pay | Admitting: Medical

## 2024-01-13 ENCOUNTER — Inpatient Hospital Stay (HOSPITAL_COMMUNITY)
Admission: RE | Admit: 2024-01-13 | Discharge: 2024-01-16 | DRG: 331 | Disposition: A | Attending: Surgery | Admitting: Surgery

## 2024-01-13 ENCOUNTER — Inpatient Hospital Stay (HOSPITAL_COMMUNITY): Payer: Self-pay | Admitting: Anesthesiology

## 2024-01-13 DIAGNOSIS — Z9049 Acquired absence of other specified parts of digestive tract: Principal | ICD-10-CM

## 2024-01-13 DIAGNOSIS — C180 Malignant neoplasm of cecum: Secondary | ICD-10-CM | POA: Diagnosis not present

## 2024-01-13 DIAGNOSIS — Z01818 Encounter for other preprocedural examination: Secondary | ICD-10-CM

## 2024-01-13 DIAGNOSIS — K660 Peritoneal adhesions (postprocedural) (postinfection): Secondary | ICD-10-CM | POA: Diagnosis not present

## 2024-01-13 DIAGNOSIS — I10 Essential (primary) hypertension: Secondary | ICD-10-CM

## 2024-01-13 DIAGNOSIS — D649 Anemia, unspecified: Secondary | ICD-10-CM

## 2024-01-13 HISTORY — PX: LAPAROSCOPIC PARTIAL COLECTOMY: SHX5907

## 2024-01-13 LAB — TYPE AND SCREEN
ABO/RH(D): O POS
Antibody Screen: NEGATIVE

## 2024-01-13 LAB — BASIC METABOLIC PANEL WITH GFR
Anion gap: 10 (ref 5–15)
BUN: 9 mg/dL (ref 8–23)
CO2: 23 mmol/L (ref 22–32)
Calcium: 9 mg/dL (ref 8.9–10.3)
Chloride: 108 mmol/L (ref 98–111)
Creatinine, Ser: 1.04 mg/dL — ABNORMAL HIGH (ref 0.44–1.00)
GFR, Estimated: 55 mL/min — ABNORMAL LOW (ref >60)
Glucose, Bld: 139 mg/dL — ABNORMAL HIGH (ref 70–99)
Potassium: 3.7 mmol/L (ref 3.5–5.1)
Sodium: 141 mmol/L (ref 135–145)

## 2024-01-13 LAB — CBC
HCT: 28.7 % — ABNORMAL LOW (ref 36.0–46.0)
Hemoglobin: 8.8 g/dL — ABNORMAL LOW (ref 12.0–15.0)
MCH: 24.2 pg — ABNORMAL LOW (ref 26.0–34.0)
MCHC: 30.7 g/dL (ref 30.0–36.0)
MCV: 78.8 fL — ABNORMAL LOW (ref 80.0–100.0)
Platelets: 311 K/uL (ref 150–400)
RBC: 3.64 MIL/uL — ABNORMAL LOW (ref 3.87–5.11)
RDW: 13.5 % (ref 11.5–15.5)
WBC: 15.6 K/uL — ABNORMAL HIGH (ref 4.0–10.5)
nRBC: 0 % (ref 0.0–0.2)

## 2024-01-13 LAB — ABO/RH: ABO/RH(D): O POS

## 2024-01-13 SURGERY — LAPAROSCOPIC PARTIAL COLECTOMY
Anesthesia: General | Laterality: Right

## 2024-01-13 MED ORDER — ENSURE PRE-SURGERY PO LIQD
296.0000 mL | Freq: Once | ORAL | Status: DC
  Filled 2024-01-13: qty 296

## 2024-01-13 MED ORDER — BISACODYL 5 MG PO TBEC: 20.0000 mg | DELAYED_RELEASE_TABLET | Freq: Once | ORAL | Status: DC

## 2024-01-13 MED ORDER — BUPIVACAINE-EPINEPHRINE (PF) 0.25% -1:200000 IJ SOLN
INTRAMUSCULAR | Status: AC
  Filled 2024-01-13: qty 60

## 2024-01-13 MED ORDER — LIDOCAINE 2% (20 MG/ML) 5 ML SYRINGE
INTRAMUSCULAR | Status: DC | PRN
  Administered 2024-01-13: 1.5 mg/kg/h via INTRAVENOUS

## 2024-01-13 MED ORDER — PANTOPRAZOLE SODIUM 40 MG PO TBEC
40.0000 mg | DELAYED_RELEASE_TABLET | Freq: Every day | ORAL | Status: DC
  Administered 2024-01-13 – 2024-01-16 (×4): 40 mg via ORAL
  Filled 2024-01-13 (×4): qty 1

## 2024-01-13 MED ORDER — ACETAMINOPHEN 500 MG PO TABS
1000.0000 mg | ORAL_TABLET | ORAL | Status: AC
  Administered 2024-01-13: 1000 mg via ORAL
  Filled 2024-01-13: qty 2

## 2024-01-13 MED ORDER — HYDRALAZINE HCL 20 MG/ML IJ SOLN: 10.0000 mg | INTRAMUSCULAR | Status: DC | PRN

## 2024-01-13 MED ORDER — DIPHENHYDRAMINE HCL 50 MG/ML IJ SOLN: 12.5000 mg | Freq: Four times a day (QID) | INTRAMUSCULAR | Status: DC | PRN

## 2024-01-13 MED ORDER — ORAL CARE MOUTH RINSE: 15.0000 mL | Freq: Once | OROMUCOSAL | Status: AC

## 2024-01-13 MED ORDER — ONDANSETRON HCL 4 MG PO TABS
4.0000 mg | ORAL_TABLET | Freq: Four times a day (QID) | ORAL | Status: DC | PRN
  Administered 2024-01-16: 4 mg via ORAL
  Filled 2024-01-13: qty 1

## 2024-01-13 MED ORDER — PROPOFOL 500 MG/50ML IV EMUL
INTRAVENOUS | Status: DC | PRN
  Administered 2024-01-13: 50 ug/kg/min via INTRAVENOUS

## 2024-01-13 MED ORDER — PROPOFOL 10 MG/ML IV BOLUS
INTRAVENOUS | Status: AC
  Filled 2024-01-13: qty 20

## 2024-01-13 MED ORDER — LACTATED RINGERS IV SOLN: INTRAVENOUS | Status: DC

## 2024-01-13 MED ORDER — LACTATED RINGERS IV SOLN: INTRAVENOUS | Status: DC | PRN

## 2024-01-13 MED ORDER — POLYETHYLENE GLYCOL 3350 17 GM/SCOOP PO POWD
238.0000 g | Freq: Once | ORAL | Status: DC
  Filled 2024-01-13: qty 238

## 2024-01-13 MED ORDER — NEOMYCIN SULFATE 500 MG PO TABS
1000.0000 mg | ORAL_TABLET | ORAL | Status: DC
  Filled 2024-01-13: qty 2

## 2024-01-13 MED ORDER — FENTANYL CITRATE (PF) 100 MCG/2ML IJ SOLN
INTRAMUSCULAR | Status: DC | PRN
  Administered 2024-01-13 (×2): 50 ug via INTRAVENOUS

## 2024-01-13 MED ORDER — SUGAMMADEX SODIUM 200 MG/2ML IV SOLN
INTRAVENOUS | Status: DC | PRN
  Administered 2024-01-13: 200 mg via INTRAVENOUS

## 2024-01-13 MED ORDER — ACETAMINOPHEN 500 MG PO TABS
1000.0000 mg | ORAL_TABLET | Freq: Four times a day (QID) | ORAL | Status: DC
  Administered 2024-01-13 – 2024-01-15 (×10): 1000 mg via ORAL
  Filled 2024-01-13 (×11): qty 2

## 2024-01-13 MED ORDER — TRAMADOL HCL 50 MG PO TABS
50.0000 mg | ORAL_TABLET | Freq: Four times a day (QID) | ORAL | 0 refills | Status: AC | PRN
  Filled 2024-01-13: qty 15, 4d supply, fill #0

## 2024-01-13 MED ORDER — HYDROMORPHONE HCL 1 MG/ML IJ SOLN
INTRAMUSCULAR | Status: AC
  Filled 2024-01-13: qty 1

## 2024-01-13 MED ORDER — HYDROMORPHONE HCL 1 MG/ML IJ SOLN
0.2500 mg | INTRAMUSCULAR | Status: DC | PRN
  Administered 2024-01-13 (×2): 0.5 mg via INTRAVENOUS

## 2024-01-13 MED ORDER — METRONIDAZOLE 500 MG PO TABS
1000.0000 mg | ORAL_TABLET | ORAL | Status: DC
  Filled 2024-01-13: qty 2

## 2024-01-13 MED ORDER — ASPIRIN 81 MG PO TBEC
81.0000 mg | DELAYED_RELEASE_TABLET | Freq: Every day | ORAL | Status: DC
  Administered 2024-01-14 – 2024-01-15 (×2): 81 mg via ORAL
  Filled 2024-01-13 (×2): qty 1

## 2024-01-13 MED ORDER — ENSURE PRE-SURGERY PO LIQD
592.0000 mL | Freq: Once | ORAL | Status: DC
  Filled 2024-01-13: qty 592

## 2024-01-13 MED ORDER — ONDANSETRON HCL 4 MG/2ML IJ SOLN
INTRAMUSCULAR | Status: AC
  Filled 2024-01-13: qty 2

## 2024-01-13 MED ORDER — AMLODIPINE BESYLATE 5 MG PO TABS
5.0000 mg | ORAL_TABLET | Freq: Every day | ORAL | Status: DC
  Administered 2024-01-14 – 2024-01-16 (×3): 5 mg via ORAL
  Filled 2024-01-13 (×3): qty 1

## 2024-01-13 MED ORDER — FLUTICASONE PROPIONATE 50 MCG/ACT NA SUSP: 1.0000 | Freq: Every day | NASAL | Status: DC | PRN

## 2024-01-13 MED ORDER — HEPARIN SODIUM (PORCINE) 5000 UNIT/ML IJ SOLN
5000.0000 [IU] | Freq: Once | INTRAMUSCULAR | Status: AC
  Administered 2024-01-13: 5000 [IU] via SUBCUTANEOUS
  Filled 2024-01-13: qty 1

## 2024-01-13 MED ORDER — DIPHENHYDRAMINE HCL 12.5 MG/5ML PO ELIX: 12.5000 mg | ORAL_SOLUTION | Freq: Four times a day (QID) | ORAL | Status: DC | PRN

## 2024-01-13 MED ORDER — FENTANYL CITRATE (PF) 100 MCG/2ML IJ SOLN
INTRAMUSCULAR | Status: AC
  Filled 2024-01-13: qty 2

## 2024-01-13 MED ORDER — DEXAMETHASONE SODIUM PHOSPHATE 4 MG/ML IJ SOLN
INTRAMUSCULAR | Status: DC | PRN
  Administered 2024-01-13: 5 mg via INTRAVENOUS

## 2024-01-13 MED ORDER — SIMETHICONE 80 MG PO CHEW
40.0000 mg | CHEWABLE_TABLET | Freq: Four times a day (QID) | ORAL | Status: DC | PRN
  Administered 2024-01-13: 40 mg via ORAL
  Filled 2024-01-13: qty 1

## 2024-01-13 MED ORDER — SUGAMMADEX SODIUM 200 MG/2ML IV SOLN
INTRAVENOUS | Status: AC
  Filled 2024-01-13: qty 2

## 2024-01-13 MED ORDER — LIDOCAINE HCL (CARDIAC) PF 100 MG/5ML IV SOSY
PREFILLED_SYRINGE | INTRAVENOUS | Status: DC | PRN
  Administered 2024-01-13: 60 mg via INTRAVENOUS

## 2024-01-13 MED ORDER — ONDANSETRON HCL 4 MG/2ML IJ SOLN: 4.0000 mg | Freq: Four times a day (QID) | INTRAMUSCULAR | Status: DC | PRN

## 2024-01-13 MED ORDER — ONDANSETRON HCL 4 MG/2ML IJ SOLN
INTRAMUSCULAR | Status: DC | PRN
  Administered 2024-01-13: 4 mg via INTRAVENOUS

## 2024-01-13 MED ORDER — ALVIMOPAN 12 MG PO CAPS
12.0000 mg | ORAL_CAPSULE | Freq: Two times a day (BID) | ORAL | Status: DC
  Administered 2024-01-14: 12 mg via ORAL
  Filled 2024-01-13: qty 1

## 2024-01-13 MED ORDER — BUPIVACAINE-EPINEPHRINE 0.25% -1:200000 IJ SOLN
INTRAMUSCULAR | Status: DC | PRN
  Administered 2024-01-13: 60 mL

## 2024-01-13 MED ORDER — HYDROMORPHONE HCL 1 MG/ML IJ SOLN: 0.5000 mg | INTRAMUSCULAR | Status: DC | PRN

## 2024-01-13 MED ORDER — ALUM & MAG HYDROXIDE-SIMETH 200-200-20 MG/5ML PO SUSP: 30.0000 mL | Freq: Four times a day (QID) | ORAL | Status: DC | PRN

## 2024-01-13 MED ORDER — 0.9 % SODIUM CHLORIDE (POUR BTL) OPTIME
TOPICAL | Status: DC | PRN
  Administered 2024-01-13: 2000 mL

## 2024-01-13 MED ORDER — ENSURE SURGERY PO LIQD
237.0000 mL | Freq: Two times a day (BID) | ORAL | Status: DC
  Administered 2024-01-14 – 2024-01-15 (×3): 237 mL via ORAL

## 2024-01-13 MED ORDER — CHLORHEXIDINE GLUCONATE 0.12 % MT SOLN
15.0000 mL | Freq: Once | OROMUCOSAL | Status: AC
  Administered 2024-01-13: 15 mL via OROMUCOSAL

## 2024-01-13 MED ORDER — IBUPROFEN 400 MG PO TABS
600.0000 mg | ORAL_TABLET | Freq: Four times a day (QID) | ORAL | Status: DC | PRN
  Administered 2024-01-13: 600 mg via ORAL
  Filled 2024-01-13: qty 1

## 2024-01-13 MED ORDER — ALVIMOPAN 12 MG PO CAPS
12.0000 mg | ORAL_CAPSULE | ORAL | Status: AC
  Administered 2024-01-13: 12 mg via ORAL
  Filled 2024-01-13: qty 1

## 2024-01-13 MED ORDER — HEPARIN SODIUM (PORCINE) 5000 UNIT/ML IJ SOLN
5000.0000 [IU] | Freq: Three times a day (TID) | INTRAMUSCULAR | Status: DC
  Administered 2024-01-13 – 2024-01-16 (×8): 5000 [IU] via SUBCUTANEOUS
  Filled 2024-01-13 (×8): qty 1

## 2024-01-13 MED ORDER — ROCURONIUM BROMIDE 100 MG/10ML IV SOLN
INTRAVENOUS | Status: DC | PRN
  Administered 2024-01-13: 20 mg via INTRAVENOUS
  Administered 2024-01-13: 50 mg via INTRAVENOUS

## 2024-01-13 MED ORDER — PROPOFOL 10 MG/ML IV BOLUS
INTRAVENOUS | Status: DC | PRN
  Administered 2024-01-13: 100 mg via INTRAVENOUS

## 2024-01-13 MED ORDER — SODIUM CHLORIDE 0.9 % IV SOLN: 12.5000 mg | INTRAVENOUS | Status: DC | PRN

## 2024-01-13 MED ORDER — SODIUM CHLORIDE 0.9 % IV SOLN
2.0000 g | INTRAVENOUS | Status: AC
  Administered 2024-01-13: 2 g via INTRAVENOUS
  Filled 2024-01-13: qty 2

## 2024-01-13 MED ORDER — TRAMADOL HCL 50 MG PO TABS: 50.0000 mg | ORAL_TABLET | Freq: Four times a day (QID) | ORAL | Status: DC | PRN

## 2024-01-13 MED ORDER — CHLORHEXIDINE GLUCONATE CLOTH 2 % EX PADS: 6.0000 | MEDICATED_PAD | Freq: Once | CUTANEOUS | Status: DC

## 2024-01-13 SURGICAL SUPPLY — 59 items
BAG COUNTER SPONGE SURGICOUNT (BAG) IMPLANT
BLADE EXTENDED COATED 6.5IN (ELECTRODE) IMPLANT
CATH MUSHROOM 30FR (CATHETERS) IMPLANT
CLIP APPLIE 5 13 M/L LIGAMAX5 (MISCELLANEOUS) IMPLANT
CLIP APPLIE ROT 10 11.4 M/L (STAPLE) IMPLANT
DEFOGGER SCOPE WARM SEASHARP (MISCELLANEOUS) ×1 IMPLANT
DERMABOND ADVANCED .7 DNX12 (GAUZE/BANDAGES/DRESSINGS) IMPLANT
DERMABOND ADVANCED .7 DNX6 (GAUZE/BANDAGES/DRESSINGS) IMPLANT
DISSECTOR BLUNT TIP ENDO 5MM (MISCELLANEOUS) IMPLANT
DRAIN CHANNEL 19F RND (DRAIN) IMPLANT
DRAPE SURG IRRIG POUCH 19X23 (DRAPES) ×1 IMPLANT
DRSG OPSITE POSTOP 4X10 (GAUZE/BANDAGES/DRESSINGS) IMPLANT
DRSG OPSITE POSTOP 4X6 (GAUZE/BANDAGES/DRESSINGS) IMPLANT
DRSG OPSITE POSTOP 4X8 (GAUZE/BANDAGES/DRESSINGS) IMPLANT
ELECT REM PT RETURN 15FT ADLT (MISCELLANEOUS) ×1 IMPLANT
EVACUATOR SILICONE 100CC (DRAIN) IMPLANT
GAUZE SPONGE 4X4 12PLY STRL (GAUZE/BANDAGES/DRESSINGS) IMPLANT
GLOVE BIO SURGEON STRL SZ7.5 (GLOVE) ×2 IMPLANT
GLOVE INDICATOR 8.0 STRL GRN (GLOVE) ×2 IMPLANT
GOWN STRL REUS W/ TWL XL LVL3 (GOWN DISPOSABLE) ×4 IMPLANT
HOLDER FOLEY CATH W/STRAP (MISCELLANEOUS) ×1 IMPLANT
IRRIGATION SUCT STRKRFLW 2 WTP (MISCELLANEOUS) ×1 IMPLANT
KIT TURNOVER KIT A (KITS) ×1 IMPLANT
LIGASURE IMPACT 36 18CM CVD LR (INSTRUMENTS) IMPLANT
NDL INSUFFLATION 14GA 120MM (NEEDLE) IMPLANT
PACK COLON (CUSTOM PROCEDURE TRAY) ×1 IMPLANT
PAD POSITIONING PINK XL (MISCELLANEOUS) ×1 IMPLANT
PENCIL SMOKE EVACUATOR (MISCELLANEOUS) IMPLANT
RELOAD STAPLE 75 3.8 BLU REG (ENDOMECHANICALS) IMPLANT
SCISSORS LAP 5X35 DISP (ENDOMECHANICALS) ×1 IMPLANT
SEALER TISSUE G2 STRG ARTC 35C (ENDOMECHANICALS) ×1 IMPLANT
SET TUBE SMOKE EVAC HIGH FLOW (TUBING) ×1 IMPLANT
SLEEVE ADV FIXATION 5X100MM (TROCAR) ×2 IMPLANT
SPIKE FLUID TRANSFER (MISCELLANEOUS) ×1 IMPLANT
SPONGE DRAIN TRACH 4X4 STRL 2S (GAUZE/BANDAGES/DRESSINGS) IMPLANT
STAPLER 90 3.5 STD SLIM (STAPLE) IMPLANT
STAPLER GUN LINEAR PROX 60 (STAPLE) IMPLANT
STAPLER PROXIMATE 75MM BLUE (STAPLE) IMPLANT
STAPLER SKIN PROX 35W (STAPLE) IMPLANT
SUT ETHILON 3 0 PS 1 (SUTURE) IMPLANT
SUT PDS AB 1 CT1 27 (SUTURE) ×2 IMPLANT
SUT PDS AB 1 TP1 54 (SUTURE) IMPLANT
SUT PROLENE 2 0 KS (SUTURE) ×1 IMPLANT
SUT PROLENE 2 0 SH DA (SUTURE) IMPLANT
SUT SILK 2 0 SH CR/8 (SUTURE) ×1 IMPLANT
SUT SILK 2-0 18XBRD TIE 12 (SUTURE) ×1 IMPLANT
SUT SILK 3 0 SH CR/8 (SUTURE) ×1 IMPLANT
SUT SILK 3-0 18XBRD TIE 12 (SUTURE) ×1 IMPLANT
SUT VIC AB 2-0 SH 27X BRD (SUTURE) IMPLANT
SUT VIC AB 3-0 SH 18 (SUTURE) IMPLANT
SUT VIC AB 3-0 SH 27X BRD (SUTURE) IMPLANT
SUT VICRYL AB 2 0 TIES (SUTURE) ×1 IMPLANT
SYSTEM LAPSCP GELPORT 120MM (MISCELLANEOUS) IMPLANT
SYSTEM WOUND ALEXIS 18CM MED (MISCELLANEOUS) IMPLANT
TOWEL OR DSP ST BLU DLX 10/PK (DISPOSABLE) IMPLANT
TRAY IRRIG W/60CC SYR STRL (SET/KITS/TRAYS/PACK) ×1 IMPLANT
TROCAR ADV FIXATION 5X100MM (TROCAR) ×1 IMPLANT
TROCAR BALLN 12MMX100 BLUNT (TROCAR) IMPLANT
TUBING CONNECTING 10 (TUBING) ×1 IMPLANT

## 2024-01-13 NOTE — H&P (Signed)
 CC: Here today for surgery  HPI: Marissa Black is an 77 y.o. female with history of HTN, GERD, iron deficiency anemia, constipation, history of breast cancer, whom is seen in the office today as a referral by Dr. Charlanne for evaluation of colon cancer .   Colonoscopy 12/03/2023 with Dr. Charlanne for iron deficiency anemia: - Malignant tumor in the proximal ascending colon/distal cecum opposite the ileocecal valve. Biopsy. Tattooed. - Moderate pancolonic diverticulosis - Nonbleeding internal hemorrhoids  CEA 4.6 (12/03/23)  CT chest/abdomen/pelvis 12/13/23: 1. Partially circumferential mass of the cecum at the level of the ileocecal valve measuring approximately 3.9 x 3.2 x 3.0 cm, consistent with known primary colonic malignancy. 2. Prominent lymph nodes in the right lower quadrant mesocolon adjacent to mass measuring up to 0.7 cm, suspicious for early nodal metastatic disease. 3. No other evidence of lymphadenopathy or metastatic disease in the chest, abdomen, or pelvis. 4. Right lumpectomy  Saw Dr. Cornelius with medical oncology in [place]  Of note, she reports that she first was diagnosed with iron deficiency anemia back 02/10/2023 and states at that time she was told she was on the border of needing a blood transfusion.   CT A/P 06/04/23  No acute findings.  Extensive colonic diverticulosis, without radiographic evidence of diverticulitis.  No evidence of metastatic disease.  Tiny hiatal hernia.  CT CAP 12/11/23 1. Partially circumferential mass of the cecum at the level of the ileocecal valve measuring approximately 3.9 x 3.2 x 3.0 cm, consistent with known primary colonic malignancy. 2. Prominent lymph nodes in the right lower quadrant mesocolon adjacent to mass measuring up to 0.7 cm, suspicious for early nodal metastatic disease. 3. No other evidence of lymphadenopathy or metastatic disease in the chest, abdomen, or pelvis. 4. Right lumpectomy  She denies  any changes in health or health history since we met in the office. No new medications/allergies. She states she is ready for surgery today. Tolerated bowel prep with satisfactory result.   PMH: HTN, GERD, iron deficiency anemia, constipation, history of breast cancer  PSH: open cholecystectomy-1970s; partial hysterectomy-low midline; Right breast lumpectomy x2  FHx: Denies any known family history of colorectal, breast, endometrial or ovarian cancer  Social Hx: Denies use of tobacco/EtOH/illicit drug. Marissa Black is here today with her daughter. She lives at home and currently, her grandson and his wife and their infant live in the house with her while they are building their home. She is retired. 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  Review of Systems:    Past Medical History:  Diagnosis Date   Age-related osteoporosis without current pathological fracture    Allergy    Anemia    Arthritis    Atherosclerosis of vertebral artery    Cancer (HCC)    Breast cancer   Cervical spinal stenosis    Colon cancer (HCC)    Degenerative cervical disc    Essential hypertension    Family history of breast cancer    Family history of kidney cancer    Family history of stomach cancer    GERD (gastroesophageal reflux disease)    History of cardiac arrhythmia    History of hypothyroidism    History of kidney stones    Kidney cyst, acquired    Personal history of breast cancer    Personal history of chemotherapy    Personal history of radiation therapy    PONV (postoperative nausea and vomiting)    Recurrent UTI  RLS (restless legs syndrome)    S/p left hip fracture    Tinnitus    Vitamin B12 deficiency     Past Surgical History:  Procedure Laterality Date   BREAST LUMPECTOMY Right 2007   x2   CHOLECYSTECTOMY     COLONOSCOPY     KNEE ARTHROSCOPY Right    left hip fracture Left    PARTIAL HYSTERECTOMY      Family History  Problem Relation Age of Onset    Kidney cancer Mother        dx in her 86s   Congestive Heart Failure Father    Lung cancer Brother        smoker   Cirrhosis Maternal Aunt    Cirrhosis Maternal Uncle    Cirrhosis Maternal Uncle    Stomach cancer Maternal Grandmother        dx in her 51s   Breast cancer Cousin 25       maternal first cousin   Breast cancer Cousin        dx in her 29s; maternal first cousin   Breast cancer Cousin        dx in her 110s, maternal first cousin   Liver disease Neg Hx    Esophageal cancer Neg Hx    Colon cancer Neg Hx    Rectal cancer Neg Hx     Social:  reports that she has never smoked. She has never used smokeless tobacco. She reports that she does not drink alcohol and does not use drugs.  Allergies:  Allergies  Allergen Reactions   Codeine Itching    Medications: I have reviewed the patient's current medications.  No results found for this or any previous visit (from the past 48 hours).  No results found.   PE Blood pressure (!) 179/72, pulse 93, temperature 98.2 F (36.8 C), temperature source Oral, resp. rate 16, SpO2 100%. Constitutional: NAD; conversant Eyes: Moist conjunctiva; no lid lag; anicteric Lungs: Normal respiratory effort CV: RRR GI: Abd soft, NT/ND; no palpable hepatosplenomegaly Psychiatric: Appropriate affect  No results found for this or any previous visit (from the past 48 hours).  No results found.  A/P: Marissa Black is an 78 y.o. 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 The pathophysiology of colon cancer was discussed as well with associated pictures using her laparoscopic colorectal surgery handbook-publisher [employer].  -We have discussed various different treatment options going forward ranging from observation  (with risks of progression, spreading and potentially fatal outcome), chemotherapy (with goals to slow progression but without curative intent,) surgery surgery (the most definitive) to address this - laparoscopic right hemicolectomy; possible lysis of adhesions (given her prior open cholecystectomy)  -The planned procedure, material risks (including, but not limited to, pain, bleeding, infection, scarring, need for blood transfusion, damage to surrounding structures- blood vessels/nerves/viscus/organs, damage to ureter, urine leak, leak from anastomosis, need for additional procedures, scenarios where a stoma may be necessary and where it may be permanent, worsening of pre-existing medical conditions, chronic diarrhea, constipation secondary to narcotic use, hernia, recurrence, blood clot, pulmonary embolus, pneumonia, heart attack, stroke, death) benefits and alternatives to surgery were discussed at length. The patient's questions were answered to her satisfaction again today, she voiced understanding and elected to proceed with surgery. Additionally, we discussed  typical postoperative expectations and the recovery process.    Lonni Pizza, MD [facility], [facility]

## 2024-01-13 NOTE — Anesthesia Procedure Notes (Signed)
 Procedure Name: Intubation Date/Time: 01/13/2024 8:45 AM  Performed by: Dartha Meckel, CRNAPre-anesthesia Checklist: Patient identified, Emergency Drugs available, Suction available and Patient being monitored Patient Re-evaluated:Patient Re-evaluated prior to induction Oxygen Delivery Method: Circle system utilized Preoxygenation: Pre-oxygenation with 100% oxygen Induction Type: IV induction Ventilation: Mask ventilation without difficulty Laryngoscope Size: Mac and 3 Grade View: Grade I Tube type: Oral Tube size: 7.0 mm Number of attempts: 1 Airway Equipment and Method: Stylet and Oral airway Placement Confirmation: ETT inserted through vocal cords under direct vision, positive ETCO2 and breath sounds checked- equal and bilateral Secured at: 21 cm Tube secured with: Tape Dental Injury: Teeth and Oropharynx as per pre-operative assessment

## 2024-01-13 NOTE — Transfer of Care (Signed)
 Immediate Anesthesia Transfer of Care Note  Patient: Marissa Black  Procedure(s) Performed: LAPAROSCOPIC PARTIAL COLECTOMY, LYSIS OF ADHESIONS (Right)  Patient Location: PACU  Anesthesia Type:General  Level of Consciousness: awake and alert   Airway & Oxygen Therapy: Patient Spontanous Breathing and Patient connected to nasal cannula oxygen  Post-op Assessment: Report given to RN and Post -op Vital signs reviewed and stable  Post vital signs: Reviewed and stable  Last Vitals:  Vitals Value Taken Time  BP 153/68 01/13/24 10:46  Temp    Pulse 83 01/13/24 10:52  Resp 18 01/13/24 10:52  SpO2 100 % 01/13/24 10:52  Vitals shown include unfiled device data.  Last Pain:  Vitals:   01/13/24 0810  TempSrc:   PainSc: 0-No pain         Complications: No notable events documented.

## 2024-01-13 NOTE — Discharge Instructions (Addendum)
 POST OP INSTRUCTIONS AFTER COLON SURGERY  DIET: Be sure to include lots of fluids daily to stay hydrated - 64oz of water per day (8, 8 oz glasses).  Avoid fast food or heavy meals for the first couple of weeks as your are more likely to get nauseated. Avoid raw/uncooked fruits or vegetables for the first 4 weeks (its ok to have these if they are blended into smoothie form). If you have fruits/vegetables, make sure they are cooked until soft enough to mash on the roof of your mouth and chew your food well. Otherwise, diet as tolerated.  Take your usually prescribed home medications unless otherwise directed.  PAIN CONTROL: Pain is best controlled by a usual combination of three different methods TOGETHER: Ice/Heat Over the counter pain medication Prescription pain medication Most patients will experience some swelling and bruising around the surgical site.  Ice packs or heating pads (30-60 minutes up to 6 times a day) will help. Some people prefer to use ice alone, heat alone, alternating between ice & heat.  Experiment to what works for you.  Swelling and bruising can take several weeks to resolve.   It is helpful to take an over-the-counter pain medication regularly for the first few weeks: Ibuprofen  (Motrin /Advil ) - 200mg  tabs - take 3 tabs (600mg ) every 6 hours as needed for pain (unless you have been directed previously to avoid NSAIDs/ibuprofen ) Acetaminophen  (Tylenol ) - you may take 650mg  every 6 hours as needed. You can take this with motrin  as they act differently on the body. If you are taking a narcotic pain medication that has acetaminophen  in it, do not take over the counter tylenol  at the same time. NOTE: You may take both of these medications together - most patients  find it most helpful when alternating between the two (i.e. Ibuprofen  at 6am, tylenol  at 9am, ibuprofen  at 12pm ..SABRA) A  prescription for pain medication should be given to you upon discharge.  Take your pain medication as  prescribed if your pain is not adequatly controlled with the over-the-counter pain reliefs mentioned above.  Avoid getting constipated.  Between the surgery and the pain medications, it is common to experience some constipation.  Increasing fluid intake and taking a fiber supplement (such as Metamucil, Citrucel, FiberCon, MiraLax , etc) 1-2 times a day regularly will usually help prevent this problem from occurring.  A mild laxative (prune juice, Milk of Magnesia, MiraLax , etc) should be taken according to package directions if there are no bowel movements after 48 hours.    Dressing: Your incisions are covered in Dermabond which is like sterile superglue for the skin. This will come off on it's own in a couple weeks. It is waterproof and you may bathe normally starting the day after your surgery in a shower. Avoid baths/pools/lakes/oceans until your wounds have fully healed.  ACTIVITIES as tolerated:   Avoid heavy lifting (>10lbs or 1 gallon of milk) for the next 6 weeks. You may resume regular daily activities as tolerated--such as daily self-care, walking, climbing stairs--gradually increasing activities as tolerated.  If you can walk 30 minutes without difficulty, it is safe to try more intense activity such as jogging, treadmill, bicycling, low-impact aerobics.  DO NOT PUSH THROUGH PAIN.  Let pain be your guide: If it hurts to do something, don't do it. You may drive when you are no longer taking prescription pain medication, you can comfortably wear a seatbelt, and you can safely maneuver your car and apply brakes.  FOLLOW UP in our  office Please call [facility] at [phone] to set up an appointment to see your surgeon in the office for a follow-up appointment approximately 2 weeks after your surgery. Make sure that you call for this appointment the day you arrive home to insure a convenient appointment time. Your appointment as of discharge is 02/23/24 at 1030 AM  9. If you have disability or  family leave forms that need to be completed, you may have them completed by your primary care physician's office; for return to work instructions, please ask our office staff and they will be happy to assist you in obtaining this documentation   When to call us  [phone]: Poor pain control Reactions / problems with new medications (rash/itching, etc)  Fever over 101.5 F (38.5 C) Inability to urinate Nausea/vomiting Worsening swelling or bruising Continued bleeding from incision. Increased pain, redness, or drainage from the incision  The clinic staff is available to answer your questions during regular business hours (8:30am-5pm).  Please don't hesitate to call and ask to speak to one of our nurses for clinical concerns.   A surgeon from [facility] is always on call at the hospitals   If you have a medical emergency, go to the nearest emergency room or call 911.  [facility] [address], [place], [place]  [address] MAIN: [phone] FAX: [phone] [email or web address]

## 2024-01-13 NOTE — Op Note (Signed)
 PATIENT: Marissa Black  78 y.o. 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 Gavan Nordby, MD  ASSISTANT: Bernarda Ned, MD  An experienced assistant was required given the complexity of this procedure and the standard of surgical care. My assistant helped with exposure through counter tension, suctioning, ligation and retraction to better visualize the surgical field. My assistant expedited sewing during the case by following my sutures. Wherever I use the term we in the report, my assistant actively helped me with that portion of the procedure.   ANESTHESIA: General endotracheal  EBL: 20 mL Total I/O In: 500 [I.V.:400; IV Piggyback:100] Out: 370 [Urine:350; Blood:20]  DRAINS: None  SPECIMEN: Right colon (including terminal ileum, cecum, ascending colon, proximal transverse colon)  COUNTS: Sponge, needle and instrument counts were reported correct x2  FINDINGS:  Mass in cecum. Tattoo distal to this. No evident metastatic disease on visceral parietal peritoneum or liver. Omental containing adhesions along right upper quadrant consistent with prior open cholecystectomy. Laparoscopic lysis of adhesions followed by laparoscopic right hemicolectomy carried out uneventfully.  NARRATIVE:  The patient was seen in the pre-op holding area. The risks, benefits, complications, treatment options, and expected outcomes were previously discussed with the patient. The patient agreed with the proposed plan and has signed the informed consent form. The patient was brought to the operating room by the surgical team, identified as Marissa Black, and the procedure verified. placed supine on the operating table and SCD's were  applied. General endotracheal anesthesia was induced without difficulty. She was then positioned supine with arms tucked.  Pressure points were evaluated and padded.  A foley catheter was then placed by nursing under sterile conditions. Hair on the abdomen was clipped.  She was secured to the operating table.  The abdomen was then prepped and draped in the standard sterile fashion. A time out was completed and the above information confirmed and need for preoperative antibiotics.  Beginning with the extraction port, a supraumbilical incision was made and carried down to the midline fascia. This was then incised with electrocautery. The peritoneum was identified and elevated between clamps and carefully opened sharply. A small Alexis wound protector with a cap and associated port was then placed. The abdomen was insufflated to 15 mmHg with Co2. A laparoscope was placed and camera inspection revealed no evidence of injury. Bilateral TAP blocks were then performed under laparoscopic visualization using a mixture of 0.25% marcaine with epinepherine. 3 additional ports were then placed under direct laparoscopic visualization - two in the left hemiabdomen and one in the right abdomen. The abdomen was surveyed. The liver and peritoneum appeared normal.  There were no signs of metastatic disease. Omentual containing adhesions in the right upper quadrant are carefully taken down the Enseal device. Total time with adhesiolysis is 30 minutes. Omentum is observed and hemostasis noted.  She was positioned in trendelenburg with left side down. The ileocolic pedicle was identified by elevating the cecum where a mass is evident within but without full thickness extension visible. Gentle blunt dissection commenced around the pedicle and the duodenum was identified inferior and medially and freed from the surrounding structures. The pedicle is circumferentially dissected and duodenum re-identified. Pedicle is then ligated and  divided with the Enseal device. Hemostasis is noted.  We mobilized the terminal ileum, taking care to avoid injuring any retroperitoneal structures.  After this we began to mobilize laterally up the Zeph Riebel line of Toldt and then took down the hepatic flexure using the Enseal device. We mobilized the omentum off of the right transverse colon. The entire colon was then flipped medially and mobilized off of the retroperitoneal structures until we could visualize the lateral edge of the duodenum underneath.  At this point, the abdomen was desufflated and the distal ileum and right colon delivered through the wound protector. The distal ileum was then transected using a GIA blue load stapler. A Jenna was then placed on the proximal staple line to assist in maintaining orientation prior to anastomosis. The remaining mesentery was divided using the Enseal device. The divided mesentery was inspected and noted to be hemostatic. The distal point of transection was then identified on the transverse colon at a location that included the right branch of the middle colics, leaving the main middle colic feeding the remaining transverse colon. This was transected using another blue load GIA stapler.  The specimen was then passed off. Attention was turned to creating the anastomosis. The distal ileum and transverse colon were inspected for orientation to ensure no twisting nor bowel included in the mesenteric defect. An anastomosis was created between the distal ileum (antimesenteric) and the transverse colon (along the antimesenteric tenia) using a 75 mm GIA blue load stapler. The staple line was inspected and noted to be hemostatic.  The common enterotomy channel was closed using a TA 90 blue load stapler incorporating the prior ileal staple line with the resected tissue. Hemostasis was achieved at the staple line using 3-0 silk U-stitches. 2-0 silk sutures were used to imbricate the corners of the staple line as well.  A 2-0  silk suture was placed securing the apex of the anastomosis. The anastomosis was palpated and noted to be widely patent. This was then placed back into the abdomen. The abdomen was then irrigated with sterile saline and hemostasis verified. The omentum was then brought down over the anastomosis. The wound protector cap was replaced and CO2 reinsufflated. The laparoscopic ports were removed under direct visualization and the sites noted to be hemostatic. The Alexis wound protector was removed, counts were reported correct, and we switched to clean instruments, gowns and drapes.   The fascia was then closed using two running #1 PDS sutures.  The skin of all incision sites was closed with 4-0 monocryl subcuticular suture. Dermabond was placed on the port sites and a sterile dressing was placed over the abdominal incision. All sponge, needle and instrument counts were reported correct. She was then awakened from anesthesia and sent to PACU in stable condition.   DISPOSITION: PACU in satisfactory condition

## 2024-01-13 NOTE — Anesthesia Postprocedure Evaluation (Signed)
 Anesthesia Post Note  Patient: Marissa Black  Procedure(s) Performed: LAPAROSCOPIC PARTIAL COLECTOMY, LYSIS OF ADHESIONS (Right)     Patient location during evaluation: PACU Anesthesia Type: General Level of consciousness: awake and alert Pain management: pain level controlled Vital Signs Assessment: post-procedure vital signs reviewed and stable Respiratory status: spontaneous breathing, nonlabored ventilation and respiratory function stable Cardiovascular status: blood pressure returned to baseline Postop Assessment: no apparent nausea or vomiting Anesthetic complications: no   No notable events documented.  Last Vitals:  Vitals:   01/13/24 1130 01/13/24 1145  BP: (!) 156/80 (!) 144/70  Pulse: 74 72  Resp: 14 12  Temp:    SpO2: 98% 100%    Last Pain:  Vitals:   01/13/24 1130  TempSrc:   PainSc: Asleep                 Vertell Row

## 2024-01-14 ENCOUNTER — Other Ambulatory Visit (HOSPITAL_COMMUNITY): Payer: Self-pay

## 2024-01-14 ENCOUNTER — Encounter (HOSPITAL_COMMUNITY): Payer: Self-pay | Admitting: Surgery

## 2024-01-14 LAB — CBC
HCT: 24 % — ABNORMAL LOW (ref 36.0–46.0)
Hemoglobin: 7.3 g/dL — ABNORMAL LOW (ref 12.0–15.0)
MCH: 23.6 pg — ABNORMAL LOW (ref 26.0–34.0)
MCHC: 30.4 g/dL (ref 30.0–36.0)
MCV: 77.7 fL — ABNORMAL LOW (ref 80.0–100.0)
Platelets: 265 K/uL (ref 150–400)
RBC: 3.09 MIL/uL — ABNORMAL LOW (ref 3.87–5.11)
RDW: 13.7 % (ref 11.5–15.5)
WBC: 7.5 K/uL (ref 4.0–10.5)
nRBC: 0 % (ref 0.0–0.2)

## 2024-01-14 LAB — BASIC METABOLIC PANEL WITH GFR
Anion gap: 8 (ref 5–15)
BUN: 8 mg/dL (ref 8–23)
CO2: 25 mmol/L (ref 22–32)
Calcium: 8.8 mg/dL — ABNORMAL LOW (ref 8.9–10.3)
Chloride: 108 mmol/L (ref 98–111)
Creatinine, Ser: 0.85 mg/dL (ref 0.44–1.00)
GFR, Estimated: >60 mL/min (ref >60)
Glucose, Bld: 100 mg/dL — ABNORMAL HIGH (ref 70–99)
Potassium: 3.6 mmol/L (ref 3.5–5.1)
Sodium: 141 mmol/L (ref 135–145)

## 2024-01-14 MED ORDER — FERROUS SULFATE 325 (65 FE) MG PO TABS
325.0000 mg | ORAL_TABLET | Freq: Two times a day (BID) | ORAL | Status: DC
  Administered 2024-01-14 – 2024-01-16 (×4): 325 mg via ORAL
  Filled 2024-01-14 (×5): qty 1

## 2024-01-14 MED ORDER — VITAMIN C 500 MG PO TABS
250.0000 mg | ORAL_TABLET | Freq: Two times a day (BID) | ORAL | Status: DC
  Administered 2024-01-14 – 2024-01-16 (×5): 250 mg via ORAL
  Filled 2024-01-14 (×5): qty 1

## 2024-01-14 NOTE — Progress Notes (Signed)
 Mobility Specialist Progress Note:   01/14/24 1322  Mobility  Activity Ambulated with assistance  Level of Assistance Contact guard assist, steadying assist  Assistive Device Front wheel walker  Distance Ambulated (ft) 300 ft  Activity Response Tolerated well  Mobility Referral Yes  Mobility visit 1 Mobility  Mobility Specialist Start Time (ACUTE ONLY) 1305  Mobility Specialist Stop Time (ACUTE ONLY) 1320  Mobility Specialist Time Calculation (min) (ACUTE ONLY) 15 min   Pt was received in bed and agreed to mobility. No complaints during ambulation. Returned to bed with all needs met.  [employer] - Mobility Specialist

## 2024-01-14 NOTE — Progress Notes (Signed)
  Subjective No acute events. No BM/flatus; had stool immediately postop, none since. No n/v. Tolerating clear liquids. Pain well controlled. Mobilizing in hallway   Objective: Vital signs in last 24 hours: Temp:  [97.4 F (36.3 C)-98.4 F (36.9 C)] 98.2 F (36.8 C) (12/04 0552) Pulse Rate:  [69-86] 72 (12/04 0552) Resp:  [12-18] 18 (12/04 0552) BP: (125-175)/(64-80) 131/69 (12/04 0552) SpO2:  [97 %-100 %] 99 % (12/04 0552) Weight:  [66.4 kg-72.4 kg] 72.4 kg (12/03 1302) Last BM Date : 01/13/24  Intake/Output from previous day: 12/03 0701 - 12/04 0700 In: 2071.6 [P.O.:840; I.V.:756.6; IV Piggyback:100] Out: 2170 [Urine:2150; Blood:20] Intake/Output this shift: No intake/output data recorded.  Gen: NAD, comfortable CV: RRR Pulm: Normal work of breathing Abd: Soft, NT/ND. Incisions c/d/I without erythema/drainage. Ext: SCDs in place  Lab Results: CBC  Recent Labs    01/13/24 1101 01/14/24 0444  WBC 15.6* 7.5  HGB 8.8* 7.3*  HCT 28.7* 24.0*  PLT 311 265   BMET Recent Labs    01/13/24 1101 01/14/24 0444  NA 141 141  K 3.7 3.6  CL 108 108  CO2 23 25  GLUCOSE 139* 100*  BUN 9 8  CREATININE 1.04* 0.85  CALCIUM 9.0 8.8*   PT/INR No results for input(s): LABPROT, INR in the last 72 hours. ABG No results for input(s): PHART, HCO3 in the last 72 hours.  Invalid input(s): PCO2, PO2  Studies/Results:  Anti-infectives: Anti-infectives (From admission, onward)    Start     Dose/Rate Route Frequency Ordered Stop   01/13/24 1400  neomycin (MYCIFRADIN) tablet 1,000 mg  Status:  Discontinued       Placed in And Linked Group   1,000 mg Oral 3 times per day 01/13/24 0722 01/13/24 1248   01/13/24 1400  metroNIDAZOLE (FLAGYL) tablet 1,000 mg  Status:  Discontinued       Placed in And Linked Group   1,000 mg Oral 3 times per day 01/13/24 9277 01/13/24 1248   01/13/24 0730  cefoTEtan (CEFOTAN) 2 g in sodium chloride  0.9 % 100 mL IVPB        2 g 200  mL/hr over 30 Minutes Intravenous On call to O.R. 01/13/24 0722 01/13/24 0924        Assessment/Plan: Patient Active Problem List   Diagnosis Date Noted   S/P laparoscopic right hemicolectomy 01/13/2024   Colon cancer, ascending (HCC) 12/08/2023    Class: Acute   Iron deficiency anemia 05/26/2023   Osteoporosis 06/10/2022   B12 deficiency 06/10/2022   Genetic testing 05/05/2016   Personal history of breast cancer    Family history of breast cancer    Family history of kidney cancer    Family history of stomach cancer    s/p Procedure(s): LAPAROSCOPIC PARTIAL COLECTOMY, LYSIS OF ADHESIONS 01/13/2024  - Doing quite well - D/C Foley, D/C IVF - Adv to soft - Continue Entereg today - immediate loose stool after surgery, none since, no flatus either - No evident bleeding or BRBPR; monitor hgb - acute on chronic blood loss anemia; will start Iron supplementation as well given microcytic component - PPX: SQH, SCDs - We spent time reviewing her procedure, findings, and plans as well as going over expectations, restrictions, and answering all related questions.   LOS: 1 day    Lonni Pizza, MD [facility]

## 2024-01-14 NOTE — Progress Notes (Signed)
 Mobility Specialist Progress Note:   01/14/24 1140  Mobility  Activity Ambulated with assistance  Level of Assistance Contact guard assist, steadying assist  Assistive Device Front wheel walker  Distance Ambulated (ft) 215 ft  Activity Response Tolerated well  Mobility Referral Yes  Mobility visit 1 Mobility  Mobility Specialist Start Time (ACUTE ONLY) 1020  Mobility Specialist Stop Time (ACUTE ONLY) 1031  Mobility Specialist Time Calculation (min) (ACUTE ONLY) 11 min   Pt was received in bed and agreed to mobility. Limited ambulation due to fatigue. Returned to bed with all needs met. Call bell in reach.  [employer] - Mobility Specialist

## 2024-01-14 NOTE — Plan of Care (Signed)
  Problem: Health Behavior/Discharge Planning: Goal: Ability to manage health-related needs will improve Outcome: Progressing   Problem: Activity: Goal: Risk for activity intolerance will decrease 01/14/2024 1624 by Noella Hands, RN Outcome: Progressing 01/14/2024 1623 by Noella Hands, RN Outcome: Progressing   Problem: Pain Managment: Goal: General experience of comfort will improve and/or be controlled Outcome: Progressing

## 2024-01-14 NOTE — Progress Notes (Signed)
 Patient complained that SCD's make her legs hurt and feel heavy. Patient given a rest from SCD's and when they were reapplied, patient had the same complaint and wanted SCD's removed. Education provided. Patient is also receiving non mechanical VTE. Plan of care ongoing.

## 2024-01-15 ENCOUNTER — Other Ambulatory Visit (HOSPITAL_COMMUNITY): Payer: Self-pay

## 2024-01-15 LAB — CBC
HCT: 22.9 % — ABNORMAL LOW (ref 36.0–46.0)
Hemoglobin: 7.1 g/dL — ABNORMAL LOW (ref 12.0–15.0)
MCH: 24.1 pg — ABNORMAL LOW (ref 26.0–34.0)
MCHC: 31 g/dL (ref 30.0–36.0)
MCV: 77.6 fL — ABNORMAL LOW (ref 80.0–100.0)
Platelets: 262 K/uL (ref 150–400)
RBC: 2.95 MIL/uL — ABNORMAL LOW (ref 3.87–5.11)
RDW: 13.7 % (ref 11.5–15.5)
WBC: 6.1 K/uL (ref 4.0–10.5)
nRBC: 0 % (ref 0.0–0.2)

## 2024-01-15 LAB — BASIC METABOLIC PANEL WITH GFR
Anion gap: 6 (ref 5–15)
BUN: 9 mg/dL (ref 8–23)
CO2: 27 mmol/L (ref 22–32)
Calcium: 8.7 mg/dL — ABNORMAL LOW (ref 8.9–10.3)
Chloride: 109 mmol/L (ref 98–111)
Creatinine, Ser: 0.75 mg/dL (ref 0.44–1.00)
GFR, Estimated: >60 mL/min (ref >60)
Glucose, Bld: 86 mg/dL (ref 70–99)
Potassium: 3.8 mmol/L (ref 3.5–5.1)
Sodium: 142 mmol/L (ref 135–145)

## 2024-01-15 NOTE — Progress Notes (Signed)
 Patient requested all four side rails up on the bed. Patient stated,  I'm scare ill fall. Education provided. Plan of care ongoing.

## 2024-01-15 NOTE — Progress Notes (Signed)
 Mobility Specialist Progress Note:   01/15/24 1038  Mobility  Activity Ambulated with assistance  Level of Assistance Standby assist, set-up cues, supervision of patient - no hands on  Assistive Device Front wheel walker  Distance Ambulated (ft) 330 ft  Activity Response Tolerated well  Mobility Referral Yes  Mobility visit 1 Mobility  Mobility Specialist Start Time (ACUTE ONLY) 0935  Mobility Specialist Stop Time (ACUTE ONLY) 0946  Mobility Specialist Time Calculation (min) (ACUTE ONLY) 11 min   Pt was received in bed and agreed to mobility. No complaints during ambulation. Returned to bed with all needs met. Call bell in reach.  [employer] - Mobility Specialist

## 2024-01-15 NOTE — Plan of Care (Signed)
  Problem: Pain Managment: Goal: General experience of comfort will improve and/or be controlled Outcome: Progressing

## 2024-01-15 NOTE — Progress Notes (Signed)
   01/15/24 1103  TOC Brief Assessment  Insurance and Status Reviewed  Patient has primary care physician Yes  Home environment has been reviewed home with family  Prior level of function: independent  Prior/Current Home Services No current home services  Social Drivers of Health Review SDOH reviewed no interventions necessary  Readmission risk has been reviewed Yes  Transition of care needs no transition of care needs at this time

## 2024-01-15 NOTE — Plan of Care (Signed)
  Problem: Clinical Measurements: Goal: Postoperative complications will be avoided or minimized Outcome: Progressing   Problem: Respiratory: Goal: Respiratory status will improve Outcome: Progressing   Problem: Skin Integrity: Goal: Will show signs of wound healing Outcome: Progressing   Problem: Education: Goal: Knowledge of General Education information will improve Description: Including pain rating scale, medication(s)/side effects and non-pharmacologic comfort measures Outcome: Progressing

## 2024-01-15 NOTE — Progress Notes (Signed)
 Mobility Specialist Progress Note:   01/15/24 1421  Mobility  Activity Ambulated with assistance  Level of Assistance Standby assist, set-up cues, supervision of patient - no hands on  Assistive Device Front wheel walker  Distance Ambulated (ft) 450 ft  Activity Response Tolerated well  Mobility Referral Yes  Mobility visit 1 Mobility  Mobility Specialist Start Time (ACUTE ONLY) 1355  Mobility Specialist Stop Time (ACUTE ONLY) 1405  Mobility Specialist Time Calculation (min) (ACUTE ONLY) 10 min   Pt was received in bed and agreed to mobility. No complaints during ambulation. Returned to bed with all needs met. Call bell in reach.  [employer] - Mobility Specialist

## 2024-01-16 ENCOUNTER — Other Ambulatory Visit (HOSPITAL_COMMUNITY): Payer: Self-pay

## 2024-01-16 LAB — BASIC METABOLIC PANEL WITH GFR
Anion gap: 7 (ref 5–15)
BUN: 9 mg/dL (ref 8–23)
CO2: 28 mmol/L (ref 22–32)
Calcium: 9 mg/dL (ref 8.9–10.3)
Chloride: 107 mmol/L (ref 98–111)
Creatinine, Ser: 0.7 mg/dL (ref 0.44–1.00)
GFR, Estimated: >60 mL/min (ref >60)
Glucose, Bld: 87 mg/dL (ref 70–99)
Potassium: 3.7 mmol/L (ref 3.5–5.1)
Sodium: 142 mmol/L (ref 135–145)

## 2024-01-16 LAB — CBC
HCT: 24.4 % — ABNORMAL LOW (ref 36.0–46.0)
Hemoglobin: 7.5 g/dL — ABNORMAL LOW (ref 12.0–15.0)
MCH: 24 pg — ABNORMAL LOW (ref 26.0–34.0)
MCHC: 30.7 g/dL (ref 30.0–36.0)
MCV: 78 fL — ABNORMAL LOW (ref 80.0–100.0)
Platelets: 277 K/uL (ref 150–400)
RBC: 3.13 MIL/uL — ABNORMAL LOW (ref 3.87–5.11)
RDW: 13.8 % (ref 11.5–15.5)
WBC: 6 K/uL (ref 4.0–10.5)
nRBC: 0 % (ref 0.0–0.2)

## 2024-01-16 NOTE — Progress Notes (Signed)
 Discharge med in a secure bag delivered to patient in room by this RN. Patient's nurse states daughter is on her way, approx travel time  one hour per patient

## 2024-01-16 NOTE — Discharge Summary (Signed)
 [facility] Discharge Summary   Patient ID: Marissa Black MRN: [id number] DOB/AGE: 04/29/1945 78 y.o.  Admit date: 01/13/2024 Discharge date: 01/16/2024  Admitting Diagnosis: Cecal adenocarcinoma  Discharge Diagnosis Patient Active Problem List   Diagnosis Date Noted   S/P laparoscopic right hemicolectomy 01/13/2024   Colon cancer, ascending (HCC) 12/08/2023   Iron deficiency anemia 05/26/2023   Osteoporosis 06/10/2022   B12 deficiency 06/10/2022   Genetic testing 05/05/2016   Personal history of breast cancer    Family history of breast cancer    Family history of kidney cancer    Family history of stomach cancer     Consultants None  Imaging: No results found.  Procedures Laparoscopic right colectomy with lysis of adhesions on 01/13/24  Hospital Course:  Patient is a 78 year old female who is s/p right hemicolectomy and lysis of adhesions for invasive adenocarcinoma of the cecum. Intraoperatively mass noted in cecum with tattoo distal to this. No evidence of metastatic disease, omental adhesion along RUQ from prior cholecystectomy. Uneventful lysis of adhesions and laparoscopic right hemicolectomy. Postoperatively patient was started on a clear liquid diet and advanced to a regular diet. By time of discharge they were ambulating, tolerating PO, and having bowel function. H/H was stable. Patient having increasing appetite and feels she will have better appetite with foods at home.   Physical Exam: General:  Alert, NAD, pleasant, comfortable Abd:  Soft, ND, mild appropriate tenderness, incisions C/D/I with minimal ecchymosis  I or a member of my team have reviewed this patient in the Controlled Substance Database.   Allergies as of 01/16/2024       Reactions   Codeine Itching        Medication List     TAKE these medications    amLODipine  5 MG tablet Commonly known as: NORVASC  Take 5 mg by mouth daily.   aspirin  EC 81 MG  tablet Take 81 mg by mouth at bedtime.   cyanocobalamin  1000 MCG/ML injection Commonly known as: VITAMIN B12 Inject 1,000 mcg into the muscle every 30 (thirty) days.   fluticasone  50 MCG/ACT nasal spray Commonly known as: FLONASE  Place 1 spray into both nostrils daily as needed for allergies.   pantoprazole  40 MG tablet Commonly known as: PROTONIX  Take 40 mg by mouth daily.   traMADol  50 MG tablet Commonly known as: Ultram  Take 1 tablet (50 mg total) by mouth every 6 (six) hours as needed for up to 5 days (postop pain not controlled with tylenol  and ibuprofen  first).   TURMERIC CURCUMIN PO Take 1 capsule by mouth daily.   VITAMIN K2-VITAMIN D3 PO Take 1 capsule by mouth daily.          Follow-up Information     Teresa Lonni HERO, MD Follow up on 02/23/2024.   Specialties: General Surgery, Colon and Rectal Surgery Why: Please arrive by 9:50 am Contact information: [address] [place] [address] [phone]                 Signed: Richerd Silversmith , MD [facility] Surgery 01/16/2024, 8:52 AM Please see Amion for pager number during day hours 7:00am-4:30pm

## 2024-01-18 LAB — SURGICAL PATHOLOGY

## 2024-01-19 ENCOUNTER — Ambulatory Visit: Payer: Self-pay | Admitting: Surgery

## 2024-01-20 ENCOUNTER — Telehealth: Payer: Self-pay | Admitting: Oncology

## 2024-01-20 NOTE — Telephone Encounter (Signed)
 Patient has been scheduled for follow-up visit per 01/20/2024 LOS.  Pt aware of scheduled appt details.

## 2024-01-27 ENCOUNTER — Encounter (HOSPITAL_COMMUNITY): Payer: Self-pay

## 2024-01-27 ENCOUNTER — Other Ambulatory Visit: Payer: Self-pay

## 2024-01-27 NOTE — Progress Notes (Signed)
 The proposed treatment discussed in conference is for discussion purpose only and is not a binding recommendation.  The patients have not been physically examined, or presented with their treatment options.  Therefore, final treatment plans cannot be decided.

## 2024-02-12 ENCOUNTER — Inpatient Hospital Stay: Attending: Oncology

## 2024-02-12 ENCOUNTER — Encounter: Payer: Self-pay | Admitting: Oncology

## 2024-02-12 ENCOUNTER — Inpatient Hospital Stay: Admitting: Oncology

## 2024-02-12 ENCOUNTER — Other Ambulatory Visit: Payer: Self-pay | Admitting: Oncology

## 2024-02-12 VITALS — BP 142/69 | HR 77 | Temp 97.7°F | Resp 16 | Ht 64.0 in | Wt 148.0 lb

## 2024-02-12 DIAGNOSIS — Z923 Personal history of irradiation: Secondary | ICD-10-CM | POA: Diagnosis not present

## 2024-02-12 DIAGNOSIS — C779 Secondary and unspecified malignant neoplasm of lymph node, unspecified: Secondary | ICD-10-CM | POA: Diagnosis present

## 2024-02-12 DIAGNOSIS — C180 Malignant neoplasm of cecum: Secondary | ICD-10-CM | POA: Diagnosis present

## 2024-02-12 DIAGNOSIS — Z800 Family history of malignant neoplasm of digestive organs: Secondary | ICD-10-CM | POA: Diagnosis not present

## 2024-02-12 DIAGNOSIS — Z9221 Personal history of antineoplastic chemotherapy: Secondary | ICD-10-CM | POA: Diagnosis not present

## 2024-02-12 DIAGNOSIS — C182 Malignant neoplasm of ascending colon: Secondary | ICD-10-CM | POA: Diagnosis not present

## 2024-02-12 DIAGNOSIS — Z79899 Other long term (current) drug therapy: Secondary | ICD-10-CM | POA: Insufficient documentation

## 2024-02-12 DIAGNOSIS — D509 Iron deficiency anemia, unspecified: Secondary | ICD-10-CM | POA: Insufficient documentation

## 2024-02-12 DIAGNOSIS — M810 Age-related osteoporosis without current pathological fracture: Secondary | ICD-10-CM | POA: Diagnosis not present

## 2024-02-12 DIAGNOSIS — Z801 Family history of malignant neoplasm of trachea, bronchus and lung: Secondary | ICD-10-CM | POA: Diagnosis not present

## 2024-02-12 DIAGNOSIS — Z853 Personal history of malignant neoplasm of breast: Secondary | ICD-10-CM | POA: Diagnosis not present

## 2024-02-12 DIAGNOSIS — E538 Deficiency of other specified B group vitamins: Secondary | ICD-10-CM | POA: Diagnosis not present

## 2024-02-12 DIAGNOSIS — Z803 Family history of malignant neoplasm of breast: Secondary | ICD-10-CM | POA: Insufficient documentation

## 2024-02-12 DIAGNOSIS — Z8051 Family history of malignant neoplasm of kidney: Secondary | ICD-10-CM | POA: Diagnosis not present

## 2024-02-12 LAB — CMP (CANCER CENTER ONLY)
ALT: 7 U/L (ref 0–44)
AST: 19 U/L (ref 15–41)
Albumin: 4.4 g/dL (ref 3.5–5.0)
Alkaline Phosphatase: 98 U/L (ref 38–126)
Anion gap: 11 (ref 5–15)
BUN: 15 mg/dL (ref 8–23)
CO2: 24 mmol/L (ref 22–32)
Calcium: 9.7 mg/dL (ref 8.9–10.3)
Chloride: 104 mmol/L (ref 98–111)
Creatinine: 0.97 mg/dL (ref 0.44–1.00)
GFR, Estimated: 60 mL/min — ABNORMAL LOW
Glucose, Bld: 101 mg/dL — ABNORMAL HIGH (ref 70–99)
Potassium: 4.1 mmol/L (ref 3.5–5.1)
Sodium: 139 mmol/L (ref 135–145)
Total Bilirubin: 0.4 mg/dL (ref 0.0–1.2)
Total Protein: 7.2 g/dL (ref 6.5–8.1)

## 2024-02-12 LAB — CEA (ACCESS): CEA (CHCC): 1.31 ng/mL (ref 0.00–5.00)

## 2024-02-12 LAB — CBC WITH DIFFERENTIAL (CANCER CENTER ONLY)
Abs Immature Granulocytes: 0.01 K/uL (ref 0.00–0.07)
Basophils Absolute: 0 K/uL (ref 0.0–0.1)
Basophils Relative: 1 %
Eosinophils Absolute: 0.1 K/uL (ref 0.0–0.5)
Eosinophils Relative: 1 %
HCT: 31.5 % — ABNORMAL LOW (ref 36.0–46.0)
Hemoglobin: 9.3 g/dL — ABNORMAL LOW (ref 12.0–15.0)
Immature Granulocytes: 0 %
Lymphocytes Relative: 25 %
Lymphs Abs: 1.5 K/uL (ref 0.7–4.0)
MCH: 21.8 pg — ABNORMAL LOW (ref 26.0–34.0)
MCHC: 29.5 g/dL — ABNORMAL LOW (ref 30.0–36.0)
MCV: 73.9 fL — ABNORMAL LOW (ref 80.0–100.0)
Monocytes Absolute: 0.3 K/uL (ref 0.1–1.0)
Monocytes Relative: 5 %
Neutro Abs: 4.1 K/uL (ref 1.7–7.7)
Neutrophils Relative %: 68 %
Platelet Count: 326 K/uL (ref 150–400)
RBC: 4.26 MIL/uL (ref 3.87–5.11)
RDW: 15.3 % (ref 11.5–15.5)
WBC Count: 6 K/uL (ref 4.0–10.5)
nRBC: 0 % (ref 0.0–0.2)

## 2024-02-12 NOTE — Progress Notes (Signed)
 " [facility]  [address] [place],  [place]  [address] [phone]  Clinic Day: 02/12/2024  Referring physician: Thurmond Cathlyn LABOR., MD  ASSESSMENT & PLAN:  Assessment: Adenocarcinoma of the Cecum This was discovered during investigation of her iron deficiency. Endoscopy and colonoscopy were performed on 12/03/2023 and multiple 4 to 5 mm sessile polyps were found in the gastric body which were resected.  A 3 cm infiltrative, polypoid, sessile and ulcerated non-obstructing mass was found in the proximal ascending colon/distal cecum, opposite ileocecal valve which was biopsied, and a 6 mm polyp just distal to the mass which was not removed. Pathology revealed this to be an invasive moderately differentiated colonic adenocarcinoma in the ascending colon.  She had a right colectomy on 01/13/2024 and surgical pathology of her right colon mass revealed an invasive moderately differentiated adenocarcinoma invading into pericolic adipose tissue, measuring 5.2 x 2.5 x 0.6 cm (pT3). Three incidental tubular adenomas were found. Margins were free of adenomatous change and carcinoma, but angiolymphatic invasion was present. Twelve of the thirty-one pericolic lymph nodes had metastatic carcinoma (12/31, pN2b). Two tumor deposits were identified. Pathology confirms that this is stage IIIC (T3 N2b M0) since CT scan negative for metastasis.  I therefore recommend FOLFOX chemotherapy but the patient is very much against taking chemo after going through it nearly 20 years ago for breast cancer. I discussed the risks and benefits as well as the possible alternative of oral capecitabine. She needs time to think this over.  Personal history of breast cancer Remote history of stage IIB hormone receptor positive breast cancer diagnosed in March 2007. She treated with lumpectomy, adjuvant radiation and partial adjuvant endocrine therapy.  Bilateral diagnostic mammogram and right breast ultrasound this month did  not reveal any evidence of malignancy.    Iron deficiency anemia Iron deficiency anemia diagnosed in December 2024, felt to be most likely due to chronic GI blood loss.  The patient reported difficulties tolerating oral iron mainly due to constipation.  She received IV iron in the form of Feraheme  in April with a good response. She had evidence of recurrent iron deficiency and received IV Feraheme  in August and September, 2025. Repeat iron studies last month confirmed iron deficiency and she was given IV iron once again. Her hemoglobin has improved from 7.5 in the hospital 4 weeks ago to 9.3 today and will likely improve further.    B12 deficiency History of B12 deficiency. She continues B12 monthly at Dr. Wyvonnia office.    Osteoporosis She is not on any treatment for this and very hesitant to consider any. I reviewed some of her options and she will think over.  She will follow up with Dr.Grisso regarding the monitoring and treatment of this.  Plan: She feels fair and denies pain. She had a right colectomy on 01/13/2024 and surgical pathology of her right colon mass revealed an invasive moderately differentiated adenocarcinoma invading into pericolic adipose, measuring 5.2 x 2.5 x 0.6 cm (pT3). Three incidental tubular adenomas were found. Margins were free of adenomatous change and carcinoma, but angiolymphatic invasion was present. Twelve of the thirty-one pericolic lymph nodes had metastatic carcinoma (12/31, pN2b). Two tumor deposits were identified. Pathology confirms that this is stage IIIC (T3 N2b M0) with CT negative for metastasis. I explained the pathology report to her and her daughter and recommended adjuvant chemotherapy with FOLFOX due to her elevated risk of recurrence. I explained the common side effects such as diarrhea, nausea, fatigue, abnormal blood  counts, hair loss, and neuropathy, as well as the expected treatment timeline. We also discussed the alternative of oral chemotherapy  with capecitabine, which would still reduce her risk of recurrence, but not as successfully. I will schedule her to meet with an APP for formal chemotherapy education, but the patient has not decided whether she will proceed or not.  If she decides to go ahead with FOLFOX treatment, we will plan for port placement, but for now she would like more time to weigh her options and wishes more information. She has been through chemotherapy for breast cancer nearly 20 years ago and this included adriamycin and paclitaxel so she had significant toxicities. They are mainly concerned with infection susceptibility due to past port-related sepsis. She has a WBC of 6.0, a low hemoglobin of 9.3 up from 7.5 in the hospital, and platelet count of 326,000. Her CMP is normal. Her CEA is pending. Her last CEA on 12/15/2023 was elevated at 8.27. I will order Signatera testing. Her follow up at this point depends on her decision regarding chemotherapy. She will let me know. The patient understands the plans discussed today and is in agreement with them.  She knows to contact our office if she develops concerns prior to her next visit.   I provided 35 minutes of face-to-face time during this encounter and > 50% was spent counseling as documented under my assessment and plan.   Marissa VEAR Cornish, MD  [facility] A DEPT OF [facility] [address] [place] [address] Dept: [phone] Dept Fax: [phone]   No orders of the defined types were placed in this encounter.  CHIEF COMPLAINT:  CC: Stage IIIC colon adenocarcinoma / Iron deficiency anemia  Current Treatment: IV iron as needed  HISTORY OF PRESENT ILLNESS:  Marissa Black is a 79 y.o. female with iron deficiency who was referred in consultation by Dr. Lynnie Bring for assessment and management.  He saw her on April 11 for consideration of GI evaluation.  CBC revealed 11.9 with an MCV of  81, WBC 7.3 with a normal differential and platelets 282,000.  She had worsening iron was 32, TIBC 304 and iron saturation 6.5% and ferritin 7.5 despite oral iron supplement.   She was scheduled for CT abdomen and pelvis April 24, with plans for EGD and colonoscopy. She saw Dr. Thurmond on December 30 for routine follow-up.  CBC without differential revealed WBCs 5.4, hemoglobin 8.5 with an MCV 71, and platelets 362,000.  CMP was unremarkable. She was called to come back for additional testing, but to the emergency room if she experiences any bleeding.  She did end up going to to [facility] ER on January 2 with a conjunctival hemorrhage.  CBC revealed WBC 6.9, hemoglobin 9.3, MCV 78, platelets 389,000.  She had evidence of iron deficiency with serum iron 22, TIBC 466, saturation 4.7% and ferritin 5.9.  Stool Hemoccult was negative. She was started on ferrous sulfate  daily.  She was seen again by Merlynn Push at Dr. Wyvonnia office on January 27.  CBC revealed hemoglobin 9.4 with an MCV of 71, WBCs 2.7, with 63% neutrophils, 38% lymphocytes, 8% monocytes, and 1% basophils, and platelets 277,000.  Iron studies were equivocal with iron 201, TIBC 442 and iron saturation 45%.  Ferritin was 14. Oral iron was continued, but she eventually required IV iron due to poor tolerance.   Of note, she also has a history of  B12 deficiency and continues B12 monthly at Dr. Wyvonnia office.  She also has a remote history of stage IIB hormone and HER2 receptor positive breast cancer treated with lumpectomy, adjuvant chemotherapy/HER2 targeted therapy and adjuvant radiation. She was placed on anastrozole in November 2008, but but stopped it on her own in March 2010 because of increasing arthralgias.  Due to her personal and family history of malignancy, she underwent genetic testing for hereditary cancer syndromes with the [employer] multi cancer panel test in March 2018.  This did not reveal any clinically significant mutations  or variants of uncertain significance.  Her last mammogram was in August 2023 and did not reveal any evidence of malignancy.  Dr. Cornelius discharged her from clinic last year, as she follows closely with Dr. Thurmond and he had been ordering her mammograms.    The patient reported difficulty tolerating oral iron due to constipation.  She is on aspirin  for atherosclerosis and stated Dr. Charlanne did not recommend holding her aspirin .  She received IV iron in the form of Feraheme  in April with a good response.   CT abdomen and pelvis in April did not reveal any acute findings.  Extensive colonic diverticulosis without diverticulitis and a tiny hiatal hernia were seen.  She canceled her EGD and colonoscopy as her daughter became very ill and still requires surgery. Endoscopy and colonoscopy were performed on 12/03/2023 and multiple 4 to 5 mm sessile polyps were found in the gastric body which were resected.  A 3 cm infiltrative, polypoid, sessile and ulcerated non-obstructing mass was found in the proximal ascending colon/distal cecum, opposite ileocecal valve which was biopsied, and a 6 mm polyp just distal to the mass which was not removed. Pathology revealed this to be an invasive moderately differentiated colonic adenocarcinoma in the ascending colon.She had a right colectomy on 01/13/2024 and surgical pathology of her right colon mass revealed an invasive moderately differentiated adenocarcinoma invading into pericolic adipose, measuring 5.2 x 2.5 x 0.6 cm (pT3). Three incidental tubular adenomas were found. Margins were free of adenomatous change and carcinoma, but angiolymphatic invasion was present. Twelve of the thirty-one pericolic lymph nodes had metastatic carcinoma (12/31, pN2b). Two tumor deposits were identified. Pathology confirms that this is stage IIIC. I explained the pathology report to her and her daughter and recommended adjuvant chemotherapy with FOLFOX due to her elevated risk of  recurrence.  INTERVAL HISTORY:  Marissa Black is here today for clinical assessment of her newly diagnosed ascending colon adenocarcinoma. Patient states that she feels fair and denies pain. She had a right colectomy on 01/13/2024 and surgical pathology of her right colon mass revealed an invasive moderately differentiated adenocarcinoma invading into pericolic adipose, measuring 5.2 x 2.5 x 0.6 cm (pT3). Three incidental tubular adenomas were found. Margins were free of adenomatous change and carcinoma, but angiolymphatic invasion was present. Twelve of the thirty-one pericolic lymph nodes had metastatic carcinoma (12/31, pN2b). Two tumor deposits were identified. Pathology confirms that this is stage IIIC (T3 N2b M0) with CT negative for metastasis. I explained the pathology report to her and her daughter and recommended adjuvant chemotherapy with FOLFOX due to her elevated risk of recurrence. I explained the common side effects such as diarrhea, nausea, fatigue, abnormal blood counts, hair loss, and neuropathy, as well as the expected treatment timeline. We also discussed the alternative of oral chemotherapy with capecitabine, which would still reduce her risk of recurrence, but not as successfully. I will schedule her to meet with an APP for formal  chemotherapy education, but the patient has not decided whether she will proceed or not.  If she decides to go ahead with FOLFOX treatment, we will plan for port placement, but for now she would like more time to weigh her options. She has been through chemotherapy for breast cancer nearly 20 years ago and this included adriamycin and paclitaxel so she had significant toxicities. They are mainly concerned with infection susceptibility due to past port-related sepsis. She has a WBC of 6.0, a low hemoglobin of 9.3 up from 7.5 in the hospital, and platelet count of 326,000. Her CMP is normal. Her CEA is pending. Her last CEA on 12/15/2023 was elevated at 8.27. I will order  Signatera testing. Her follow up at this point depends on her decision regarding chemotherapy. She will let me know.  She denies fever, chills, night sweats, or other signs of infection. She denies cardiorespiratory and gastrointestinal issues. She  denies pain. Her appetite is good and Her weight has increased 4 pounds over last 4 weeks. She is accompanied by her daughter.   REVIEW OF SYSTEMS:  Review of Systems  Constitutional:  Positive for fatigue. Negative for appetite change, chills, diaphoresis, fever and unexpected weight change.  HENT:  Negative.  Negative for hearing loss, lump/mass, mouth sores, nosebleeds, sore throat, tinnitus, trouble swallowing and voice change.   Eyes: Negative.   Respiratory: Negative.  Negative for chest tightness, cough, hemoptysis, shortness of breath and wheezing.   Cardiovascular: Negative.  Negative for chest pain, leg swelling and palpitations.  Gastrointestinal: Negative.  Negative for abdominal distention, abdominal pain, blood in stool, constipation, diarrhea, nausea and vomiting.  Endocrine: Negative.  Negative for hot flashes.  Genitourinary: Negative.  Negative for difficulty urinating, dysuria, frequency, hematuria and vaginal bleeding.   Musculoskeletal:  Negative for arthralgias, back pain, flank pain, gait problem, myalgias, neck pain and neck stiffness.  Skin: Negative.  Negative for itching, rash and wound.  Neurological:  Negative for dizziness, extremity weakness, gait problem, headaches, light-headedness, numbness, seizures and speech difficulty.  Hematological: Negative.  Negative for adenopathy. Does not bruise/bleed easily.  Psychiatric/Behavioral:  Negative for depression and sleep disturbance. The patient is nervous/anxious.     VITALS:  Blood pressure (!) 142/69, pulse 77, temperature 97.7 F (36.5 C), temperature source Oral, resp. rate 16, height 5' 4 (1.626 m), weight 148 lb (67.1 kg), SpO2 100%.  Wt Readings from Last 3  Encounters:  02/17/24 147 lb (66.7 kg)  02/12/24 148 lb (67.1 kg)  01/16/24 165 lb 5.5 oz (75 kg)    Body mass index is 25.4 kg/m.  Performance status (ECOG): 1 - Symptomatic but completely ambulatory  PHYSICAL EXAM:  Physical Exam Vitals and nursing note reviewed. Exam conducted with a chaperone present.  Constitutional:      General: She is not in acute distress.    Appearance: Normal appearance. She is normal weight.  HENT:     Head: Normocephalic and atraumatic.     Right Ear: Tympanic membrane, ear canal and external ear normal. There is no impacted cerumen.     Left Ear: Tympanic membrane, ear canal and external ear normal. There is no impacted cerumen.     Nose: Nose normal. No congestion or rhinorrhea.     Mouth/Throat:     Mouth: Mucous membranes are moist.     Pharynx: Oropharynx is clear. No oropharyngeal exudate or posterior oropharyngeal erythema.  Eyes:     General: No scleral icterus.       Right  eye: No discharge.        Left eye: No discharge.     Extraocular Movements: Extraocular movements intact.     Conjunctiva/sclera: Conjunctivae normal.     Pupils: Pupils are equal, round, and reactive to light.  Cardiovascular:     Rate and Rhythm: Normal rate and regular rhythm.     Pulses: Normal pulses.     Heart sounds: Normal heart sounds. No murmur heard.    No friction rub. No gallop.  Pulmonary:     Effort: Pulmonary effort is normal. No respiratory distress.     Breath sounds: Normal breath sounds. No wheezing, rhonchi or rales.  Abdominal:     General: A surgical scar is present. Bowel sounds are normal. There is no distension.     Palpations: Abdomen is soft. There is no hepatomegaly, splenomegaly or mass.     Tenderness: There is no abdominal tenderness.     Comments: Healing incision to the left of the umbilicus in the upper abdomen. Large right upper quadrant scar  Musculoskeletal:        General: Normal range of motion.     Cervical back: Normal  range of motion and neck supple. No tenderness.     Right lower leg: No edema.     Left lower leg: No edema.  Lymphadenopathy:     Cervical: No cervical adenopathy.     Right cervical: No superficial, deep or posterior cervical adenopathy.    Left cervical: No superficial, deep or posterior cervical adenopathy.     Upper Body:     Right upper body: No supraclavicular, axillary or pectoral adenopathy.     Left upper body: No supraclavicular, axillary or pectoral adenopathy.     Lower Body: No right inguinal adenopathy. No left inguinal adenopathy.  Skin:    General: Skin is warm and dry.     Coloration: Skin is not jaundiced.     Findings: No rash.  Neurological:     General: No focal deficit present.     Mental Status: She is alert and oriented to person, place, and time. Mental status is at baseline.     Cranial Nerves: No cranial nerve deficit.  Psychiatric:        Mood and Affect: Mood normal.        Behavior: Behavior normal.        Thought Content: Thought content normal.        Judgment: Judgment normal.    LABS:      Latest Ref Rng & Units 02/12/2024   12:52 PM 01/16/2024    5:27 AM 01/15/2024    5:05 AM  CBC  WBC 4.0 - 10.5 K/uL 6.0  6.0  6.1   Hemoglobin 12.0 - 15.0 g/dL 9.3  7.5  7.1   Hematocrit 36.0 - 46.0 % 31.5  24.4  22.9   Platelets 150 - 400 K/uL 326  277  262       Latest Ref Rng & Units 02/12/2024   12:52 PM 01/16/2024    5:27 AM 01/15/2024    5:05 AM  CMP  Glucose 70 - 99 mg/dL 898  87  86   BUN 8 - 23 mg/dL 15  9  9    Creatinine 0.44 - 1.00 mg/dL 9.02  9.29  9.24   Sodium 135 - 145 mmol/L 139  142  142   Potassium 3.5 - 5.1 mmol/L 4.1  3.7  3.8   Chloride 98 - 111 mmol/L 104  107  109   CO2 22 - 32 mmol/L 24  28  27    Calcium 8.9 - 10.3 mg/dL 9.7  9.0  8.7   Total Protein 6.5 - 8.1 g/dL 7.2     Total Bilirubin 0.0 - 1.2 mg/dL 0.4     Alkaline Phos 38 - 126 U/L 98     AST 15 - 41 U/L 19     ALT 0 - 44 U/L 7      Lab Results  Component Value Date    TOTALPROTELP 6.8 05/26/2023   ALBUMINELP 3.7 05/26/2023   A1GS 0.3 05/26/2023   A2GS 0.7 05/26/2023   BETS 1.1 05/26/2023   GAMS 1.1 05/26/2023   MSPIKE Not Observed 05/26/2023   Lab Results  Component Value Date   TIBC 468 (H) 12/15/2023   TIBC 462 (H) 09/28/2023   TIBC 358 07/28/2023   FERRITIN 18 12/15/2023   FERRITIN 4 (L) 09/28/2023   FERRITIN 25 07/28/2023   IRONPCTSAT 5 (L) 12/15/2023   IRONPCTSAT 4 (L) 09/28/2023   IRONPCTSAT 16 07/28/2023   Lab Results  Component Value Date   LDH 173 06/11/2005    STUDIES:  Exam: 01/13/2024 Surgical Pathology FINAL MICROSCOPIC DIAGNOSIS: A. Right colon, mass, colectomy: Invasive moderately differentiated adenocarcinoma invading into pericolic adipose. (pT3) Tumor measures 5.2 x 2.5 x 0.6 cm Three incidental tubular adenomas Margins free of adenomatous change and carcinoma Angiolymphatic invasion present Twelve of the thirty-one pericolic lymph nodes with metastatic carcinoma (12/31, pN2b) Two tumor deposits identified    Exam: 12/03/2023 Surgical Pathology FINAL DIAGNOSIS:      1. Duodenum, Biopsy,  :       DUODENAL MUCOSA WITH NORMAL VILLOUS ARCHITECTURE.       NO VILLOUS ATROPHY OR INCREASED INTRAEPITHELIAL LYMPHOCYTES.        2. Stomach, biopsy,  :       FUNDIC GLAND POLYP.       NEGATIVE FOR DYSPLASIA AND MALIGNANCY.        3. Ascending Colon Biopsy,  :       INVASIVE MODERATELY DIFFERENTIATED COLONIC ADENOCARCINOMA.       SEE NOTE.        Diagnosis Note : MMR will be performed and reported as an addendum.       Dr. Macie reviewed part 3 of this case and agrees.       Dr. Charlanne was notified on 12/04/2023 at 8:45 AM.       HISTORY:   Past Medical History:  Diagnosis Date   Age-related osteoporosis without current pathological fracture    Allergy    Anemia    Arthritis    Atherosclerosis of vertebral artery    Cancer (HCC)    Breast cancer   Cervical spinal stenosis    Colon cancer (HCC)     Degenerative cervical disc    Essential hypertension    Family history of breast cancer    Family history of kidney cancer    Family history of stomach cancer    GERD (gastroesophageal reflux disease)    History of cardiac arrhythmia    History of hypothyroidism    History of kidney stones    Kidney cyst, acquired    Personal history of breast cancer    Personal history of chemotherapy    Personal history of radiation therapy    PONV (postoperative nausea and vomiting)    Recurrent UTI    RLS (restless legs syndrome)    S/p left hip  fracture    Tinnitus    Vitamin B12 deficiency     Past Surgical History:  Procedure Laterality Date   BREAST LUMPECTOMY Right 2007   x2   CHOLECYSTECTOMY     COLONOSCOPY     KNEE ARTHROSCOPY Right    LAPAROSCOPIC PARTIAL COLECTOMY Right 01/13/2024   Procedure: LAPAROSCOPIC PARTIAL COLECTOMY, LYSIS OF ADHESIONS;  Surgeon: Teresa Lonni HERO, MD;  Location: [facility];  Service: General;  Laterality: Right;  COLECTOMY RIGHT LAP   left hip fracture Left    PARTIAL HYSTERECTOMY      Family History  Problem Relation Age of Onset   Kidney cancer Mother        dx in her 39s   Congestive Heart Failure Father    Lung cancer Brother        smoker   Cirrhosis Maternal Aunt    Cirrhosis Maternal Uncle    Cirrhosis Maternal Uncle    Stomach cancer Maternal Grandmother        dx in her 33s   Breast cancer Cousin 25       maternal first cousin   Breast cancer Cousin        dx in her 71s; maternal first cousin   Breast cancer Cousin        dx in her 64s, maternal first cousin   Liver disease Neg Hx    Esophageal cancer Neg Hx    Colon cancer Neg Hx    Rectal cancer Neg Hx     Social History:  reports that she has never smoked. She has never used smokeless tobacco. She reports that she does not drink alcohol and does not use drugs.The patient is alone today.  Allergies:  Allergies  Allergen Reactions   Codeine Itching    Current  Medications: Current Outpatient Medications  Medication Sig Dispense Refill   amLODipine  (NORVASC ) 5 MG tablet Take 5 mg by mouth daily.     aspirin  EC 81 MG tablet Take 81 mg by mouth at bedtime.     cyanocobalamin  (VITAMIN B12) 1000 MCG/ML injection Inject 1,000 mcg into the muscle every 30 (thirty) days.     fluticasone  (FLONASE ) 50 MCG/ACT nasal spray Place 1 spray into both nostrils daily as needed for allergies.     pantoprazole  (PROTONIX ) 40 MG tablet Take 40 mg by mouth daily.     TURMERIC CURCUMIN PO Take 1 capsule by mouth daily.     Vitamin D-Vitamin K (VITAMIN K2-VITAMIN D3 PO) Take 1 capsule by mouth daily.     No current facility-administered medications for this visit.   LILLETTE Aretta Cook, acting as a scribe for Marissa VEAR Cornish, MD, have documented all relevant documentation on the behalf of Marissa VEAR Cornish, MD, as directed by Marissa VEAR Cornish, MD, while in the presence of Marissa VEAR Cornish, MD.  I have reviewed this report as typed by the medical scribe, and it is complete and accurate.  "

## 2024-02-15 ENCOUNTER — Telehealth: Payer: Self-pay

## 2024-02-15 NOTE — Telephone Encounter (Signed)
 Pt states, Dr Cornelius told me to call today and speak with Melissa.

## 2024-02-17 ENCOUNTER — Other Ambulatory Visit: Payer: Self-pay

## 2024-02-17 ENCOUNTER — Inpatient Hospital Stay: Admitting: Hematology and Oncology

## 2024-02-17 ENCOUNTER — Inpatient Hospital Stay

## 2024-02-17 DIAGNOSIS — C182 Malignant neoplasm of ascending colon: Secondary | ICD-10-CM

## 2024-02-17 DIAGNOSIS — C779 Secondary and unspecified malignant neoplasm of lymph node, unspecified: Secondary | ICD-10-CM | POA: Diagnosis not present

## 2024-02-18 ENCOUNTER — Encounter: Payer: Self-pay | Admitting: Hematology and Oncology

## 2024-02-29 ENCOUNTER — Telehealth: Payer: Self-pay

## 2024-02-29 NOTE — Telephone Encounter (Signed)
 Patient is wanting to know if the testing that was done last week is resulted to let her know how well she would be able to tolerate chemo.

## 2024-03-01 ENCOUNTER — Encounter: Payer: Self-pay | Admitting: Hematology and Oncology

## 2024-03-01 NOTE — Progress Notes (Signed)
 "  [facility] CARE CLINIC CONSULT NOTE [facility]  [phone] [phone]  Patient Care Team: Thurmond Cathlyn LABOR., MD as PCP - General (Internal Medicine)   Name of the patient: Marissa Black  [id number]  [birth year]   Date of visit: 03/01/24  Diagnosis- Colorectal cancer  Chief complaint/Reason for visit- Initial Meeting for [facility], preparing for starting chemotherapy    Heme/Onc history:  Oncology History  Colon cancer, ascending (HCC)  12/08/2023 Initial Diagnosis   Colon cancer, ascending (HCC)   12/23/2023 Cancer Staging   Staging form: Colon and Rectum, AJCC 8th Edition - Clinical: Stage IIIB (cT3, cN1b, cM0) - Signed by Cornelius Wanda DEL, MD on 12/23/2023 Histologic grade (G): G2 Histologic grading system: 4 grade system Laterality: Right Diagnostic confirmation: Positive histology Specimen type: Endoscopy with Biopsy Staged by: Managing physician Tumor deposits (TD): Unknown Carcinoembryonic antigen (CEA) (ng/mL): 8.3 Stage used in treatment planning: Yes National guidelines used in treatment planning: Yes Type of national guideline used in treatment planning: NCCN   01/13/2024 Cancer Staging   Staging form: Colon and Rectum, AJCC 8th Edition - Pathologic stage from 01/13/2024: Stage IIIC (pT3, pN2b, cM0) - Signed by Cornelius Wanda DEL, MD on 02/12/2024 Histopathologic type: Adenocarcinoma, NOS Stage prefix: Initial diagnosis Histologic grading system: 4 grade system Histologic grade (G): G2 Residual tumor (R): R0 Laterality: Right Tumor size (mm): 52 Lymph-vascular invasion (LVI): BOTH lymphatic and small vessel AND venous (large vessel) invasion Diagnostic confirmation: Positive histology Specimen type: Excision Staged by: Managing physician Tumor deposits (TD): Present Carcinoembryonic antigen (CEA) (ng/mL): 8.3 Perineural invasion (PNI): Absent Microsatellite instability (MSI): Stable Stage used in  treatment planning: Yes National guidelines used in treatment planning: Yes Staging comments: Will  recommend adjuvant chemotherapy     Interval history-  Patient presents to chemo care clinic today for initial meeting in preparation for starting chemotherapy. I introduced the chemo care clinic and we discussed that the role of the clinic is to assist those who are at an increased risk of emergency room visits and/or complications during the course of chemotherapy treatment. We discussed that the increased risk takes into account factors such as 79, performance status, and co-morbidities. We also discussed that for some, this might include barriers to care such as not having a primary care provider, lack of insurance/transportation, or not being able to afford medications. We discussed that the goal of the program is to help prevent unplanned ER visits and help reduce complications during chemotherapy. We do this by discussing specific risk factors to each individual and identifying ways that we can help improve these risk factors and reduce barriers to care.   Allergies[1]  Past Medical History:  Diagnosis Date   Age-related osteoporosis without current pathological fracture    Allergy    Anemia    Arthritis    Atherosclerosis of vertebral artery    Cancer (HCC)    Breast cancer   Cervical spinal stenosis    Colon cancer (HCC)    Degenerative cervical disc    Essential hypertension    Family history of breast cancer    Family history of kidney cancer    Family history of stomach cancer    GERD (gastroesophageal reflux disease)    History of cardiac arrhythmia    History of hypothyroidism    History of kidney stones    Kidney cyst, acquired    Personal history of breast cancer    Personal history of chemotherapy    Personal  history of radiation therapy    PONV (postoperative nausea and vomiting)    Recurrent UTI    RLS (restless legs syndrome)    S/p left hip fracture     Tinnitus    Vitamin B12 deficiency     Past Surgical History:  Procedure Laterality Date   BREAST LUMPECTOMY Right 2007   x2   CHOLECYSTECTOMY     COLONOSCOPY     KNEE ARTHROSCOPY Right    LAPAROSCOPIC PARTIAL COLECTOMY Right 01/13/2024   Procedure: LAPAROSCOPIC PARTIAL COLECTOMY, LYSIS OF ADHESIONS;  Surgeon: Teresa Lonni HERO, MD;  Location: [facility];  Service: General;  Laterality: Right;  COLECTOMY RIGHT LAP   left hip fracture Left    PARTIAL HYSTERECTOMY      Social History   Socioeconomic History   Marital status: Widowed    Spouse name: Not on file   Number of children: 2   Years of education: Not on file   Highest education level: High school graduate  Occupational History   Occupation: retired  Tobacco Use   Smoking status: Never   Smokeless tobacco: Never  Vaping Use   Vaping status: Never Used  Substance and Sexual Activity   Alcohol use: No   Drug use: No   Sexual activity: Not Currently  Other Topics Concern   Not on file  Social History Narrative   Not on file   Social Drivers of Health   Tobacco Use: Low Risk  (02/23/2024)   Received from [facility]   Patient History    Smoking Tobacco Use: Never    Smokeless Tobacco Use: Never    Passive Exposure: Not on file  Financial Resource Strain: Not on file  Food Insecurity: No Food Insecurity (01/13/2024)   Epic    Worried About Programme Researcher, Broadcasting/film/video in the Last Year: Never true    Ran Out of Food in the Last Year: Never true  Transportation Needs: No Transportation Needs (01/13/2024)   Epic    Lack of Transportation (Medical): No    Lack of Transportation (Non-Medical): No  Physical Activity: Not on file  Stress: Not on file  Social Connections: Moderately Integrated (01/13/2024)   Social Connection and Isolation Panel    Frequency of Communication with Friends and Family: More than three times a week    Frequency of Social Gatherings with Friends and Family: More than three times  a week    Attends Religious Services: More than 4 times per year    Active Member of [employer] or Organizations: Yes    Attends Banker Meetings: More than 4 times per year    Marital Status: Widowed  Intimate Partner Violence: Not At Risk (01/13/2024)   Epic    Fear of Current or Ex-Partner: No    Emotionally Abused: No    Physically Abused: No    Sexually Abused: No  Depression (PHQ2-9): Low Risk (02/17/2024)   Depression (PHQ2-9)    PHQ-2 Score: 0  Alcohol Screen: Low Risk (05/26/2023)   Alcohol Screen    Last Alcohol Screening Score (AUDIT): 0  Housing: Low Risk (01/13/2024)   Epic    Unable to Pay for Housing in the Last Year: No    Number of Times Moved in the Last Year: 0    Homeless in the Last Year: No  Utilities: Not At Risk (01/13/2024)   Epic    Threatened with loss of utilities: No  Health Literacy: Not on file  Family History  Problem Relation Age of Onset   Kidney cancer Mother        dx in her 13s   Congestive Heart Failure Father    Lung cancer Brother        smoker   Cirrhosis Maternal Aunt    Cirrhosis Maternal Uncle    Cirrhosis Maternal Uncle    Stomach cancer Maternal Grandmother        dx in her 43s   Breast cancer Cousin 25       maternal first cousin   Breast cancer Cousin        dx in her 11s; maternal first cousin   Breast cancer Cousin        dx in her 71s, maternal first cousin   Liver disease Neg Hx    Esophageal cancer Neg Hx    Colon cancer Neg Hx    Rectal cancer Neg Hx     Current Medications[2]     Latest Ref Rng & Units 02/12/2024   12:52 PM  CMP  Glucose 70 - 99 mg/dL 898   BUN 8 - 23 mg/dL 15   Creatinine 9.55 - 1.00 mg/dL 9.02   Sodium 864 - 854 mmol/L 139   Potassium 3.5 - 5.1 mmol/L 4.1   Chloride 98 - 111 mmol/L 104   CO2 22 - 32 mmol/L 24   Calcium 8.9 - 10.3 mg/dL 9.7   Total Protein 6.5 - 8.1 g/dL 7.2   Total Bilirubin 0.0 - 1.2 mg/dL 0.4   Alkaline Phos 38 - 126 U/L 98   AST 15 - 41 U/L 19   ALT 0 -  44 U/L 7       Latest Ref Rng & Units 02/12/2024   12:52 PM  CBC  WBC 4.0 - 10.5 K/uL 6.0   Hemoglobin 12.0 - 15.0 g/dL 9.3   Hematocrit 63.9 - 46.0 % 31.5   Platelets 150 - 400 K/uL 326       No results found.   Assessment and plan- Patient is a 79 y.o. female who presents to Chemo Care Clinic for initial meeting in preparation for starting chemotherapy for the treatment of colorectal cancer.   Chemo Care Clinic/High Risk for ER/Hospitalization during chemotherapy- We discussed the role of the chemo care clinic and identified patient specific risk factors. I discussed that patient was identified as high risk primarily based on:  Patient has past medical history positive for: Past Medical History:  Diagnosis Date   Age-related osteoporosis without current pathological fracture    Allergy    Anemia    Arthritis    Atherosclerosis of vertebral artery    Cancer (HCC)    Breast cancer   Cervical spinal stenosis    Colon cancer (HCC)    Degenerative cervical disc    Essential hypertension    Family history of breast cancer    Family history of kidney cancer    Family history of stomach cancer    GERD (gastroesophageal reflux disease)    History of cardiac arrhythmia    History of hypothyroidism    History of kidney stones    Kidney cyst, acquired    Personal history of breast cancer    Personal history of chemotherapy    Personal history of radiation therapy    PONV (postoperative nausea and vomiting)    Recurrent UTI    RLS (restless legs syndrome)    S/p left hip fracture    Tinnitus  Vitamin B12 deficiency     Patient has past surgical history positive for: Past Surgical History:  Procedure Laterality Date   BREAST LUMPECTOMY Right 2007   x2   CHOLECYSTECTOMY     COLONOSCOPY     KNEE ARTHROSCOPY Right    LAPAROSCOPIC PARTIAL COLECTOMY Right 01/13/2024   Procedure: LAPAROSCOPIC PARTIAL COLECTOMY, LYSIS OF ADHESIONS;  Surgeon: Teresa Lonni HERO, MD;   Location: [facility];  Service: General;  Laterality: Right;  COLECTOMY RIGHT LAP   left hip fracture Left    PARTIAL HYSTERECTOMY       We discussed that social determinants of health may have significant impacts on health and outcomes for cancer patients.  Today we discussed specific social determinants of performance status, alcohol use, depression, financial needs, food insecurity, housing, interpersonal violence, social connections, stress, tobacco use, and transportation.    After lengthy discussion the following were identified as areas of need:   Outpatient services: We discussed options including home based and outpatient services, DME and care program. We discusssed that patients who participate in regular physical activity report fewer negative impacts of cancer and treatments and report less fatigue.   Financial Concerns: We discussed that living with cancer can create tremendous financial burden.  We discussed options for assistance. I asked that if assistance is needed in affording medications or paying bills to please let us  know so that we can provide assistance. We discussed options for food including social services, and onsite food pantry.  We will also notify social worker to see if cancer center can provide additional support.  Referral to Social work: Introduced child psychotherapist and the services he can provide such as support with utility bill, cell phone and gas vouchers.   Support groups: We discussed options for support groups at the cancer center. If interested, please notify nurse navigator to enroll. We discussed options for managing stress including healthy eating, exercise as well as participating in no charge counseling services at the cancer center and support groups.  If these are of interest, patient can notify either myself or primary nursing team.We discussed options for management including medications and referral to quit Smart program  Transportation: We discussed  options for transportation including [employer], paratransit, bus routes, link transit, taxi/uber/lyft, and cancer center van.  I have notified primary oncology team who will help assist with arranging fleeta transportation for appointments when/if needed. We also discussed options for transportation on short notice/acute visits.  Palliative care services: We have palliative care services available in the cancer center to discuss goals of care and advanced care planning.  Please let us  know if you have any questions or would like to speak to our palliative nurse practitioner.  Symptom Management Clinic: We discussed our symptom management clinic which is available for acute concerns while receiving treatment such as nausea, vomiting or diarrhea.  We can be reached via telephone at [phone] or through my chart.  We are available for virtual or in person visits on the same day from 830 to 4 PM Monday through Friday. She denies needing specific assistance at this time and She will be followed by Dr. Yuvonne clinical team.  Plan: Discussed symptom management clinic. Discussed palliative care services. Discussed resources that are available here at the cancer center. Discussed medications and new prescriptions to begin treatment such as anti-nausea or steroids.   Disposition: RTC on   Visit Diagnosis 1. Colon cancer, ascending [facility])     Patient expressed understanding and was in agreement with  this plan. She also understands that She can call clinic at any time with any questions, concerns, or complaints.   I provided 60 minutes of face to face during this encounter, and > 50% was spent counseling as documented under my assessment & plan.   Eleanor Bach, FNP- [facility] [place] [phone]     [1]  Allergies Allergen Reactions   Codeine Itching  [2]  Current Outpatient Medications:    amLODipine  (NORVASC ) 5 MG tablet, Take 5 mg by mouth daily., Disp: , Rfl:    aspirin   EC 81 MG tablet, Take 81 mg by mouth at bedtime., Disp: , Rfl:    cyanocobalamin  (VITAMIN B12) 1000 MCG/ML injection, Inject 1,000 mcg into the muscle every 30 (thirty) days., Disp: , Rfl:    fluticasone  (FLONASE ) 50 MCG/ACT nasal spray, Place 1 spray into both nostrils daily as needed for allergies., Disp: , Rfl:    pantoprazole  (PROTONIX ) 40 MG tablet, Take 40 mg by mouth daily., Disp: , Rfl:    TURMERIC CURCUMIN PO, Take 1 capsule by mouth daily., Disp: , Rfl:    Vitamin D-Vitamin K (VITAMIN K2-VITAMIN D3 PO), Take 1 capsule by mouth daily., Disp: , Rfl:   "

## 2024-03-09 ENCOUNTER — Telehealth: Payer: Self-pay

## 2024-03-09 NOTE — Telephone Encounter (Signed)
 Patient has called wanting the test results from [facility].  Can call patient at [phone].

## 2024-03-15 ENCOUNTER — Other Ambulatory Visit: Payer: Self-pay | Admitting: Oncology

## 2024-03-15 DIAGNOSIS — C182 Malignant neoplasm of ascending colon: Secondary | ICD-10-CM

## 2024-03-18 ENCOUNTER — Telehealth: Payer: Self-pay

## 2024-03-18 NOTE — Telephone Encounter (Signed)
 Pt notified that she has appt's for 04/01/2024 @ 4pm for labs, and to see Dr Cornelius. She verbalized understanding.

## 2024-04-01 ENCOUNTER — Inpatient Hospital Stay: Admitting: Oncology

## 2024-04-01 ENCOUNTER — Inpatient Hospital Stay
